# Patient Record
Sex: Male | Born: 1985 | Race: White | Hispanic: No | Marital: Married | Smoking: Former smoker
Health system: Southern US, Community
[De-identification: ages and names within clinical notes are randomized; demographics above are authoritative.]

## PROBLEM LIST (undated history)

## (undated) DIAGNOSIS — I10 Essential (primary) hypertension: Secondary | ICD-10-CM

## (undated) DIAGNOSIS — M5126 Other intervertebral disc displacement, lumbar region: Secondary | ICD-10-CM

## (undated) HISTORY — PX: HERNIA REPAIR: SHX51

---

## 2009-04-08 ENCOUNTER — Emergency Department: Payer: Self-pay | Admitting: Emergency Medicine

## 2010-03-10 ENCOUNTER — Emergency Department: Payer: Self-pay | Admitting: Emergency Medicine

## 2010-04-17 ENCOUNTER — Emergency Department: Payer: Self-pay | Admitting: Emergency Medicine

## 2010-06-01 ENCOUNTER — Emergency Department: Payer: Self-pay | Admitting: Emergency Medicine

## 2010-07-17 ENCOUNTER — Emergency Department: Payer: Self-pay | Admitting: Emergency Medicine

## 2010-12-12 ENCOUNTER — Ambulatory Visit: Payer: Self-pay | Admitting: Internal Medicine

## 2011-02-10 ENCOUNTER — Emergency Department: Payer: Self-pay | Admitting: Emergency Medicine

## 2011-06-23 ENCOUNTER — Emergency Department: Payer: Self-pay | Admitting: Emergency Medicine

## 2011-07-03 ENCOUNTER — Emergency Department: Payer: Self-pay | Admitting: *Deleted

## 2012-03-19 ENCOUNTER — Emergency Department: Payer: Self-pay | Admitting: Emergency Medicine

## 2012-07-13 ENCOUNTER — Emergency Department: Payer: Self-pay | Admitting: Emergency Medicine

## 2012-12-07 ENCOUNTER — Emergency Department: Payer: Self-pay | Admitting: Emergency Medicine

## 2013-10-05 ENCOUNTER — Emergency Department: Payer: Self-pay | Admitting: Emergency Medicine

## 2013-10-05 LAB — BASIC METABOLIC PANEL
ANION GAP: 3 — AB (ref 7–16)
BUN: 11 mg/dL (ref 7–18)
CO2: 30 mmol/L (ref 21–32)
CREATININE: 0.96 mg/dL (ref 0.60–1.30)
Calcium, Total: 9.1 mg/dL (ref 8.5–10.1)
Chloride: 103 mmol/L (ref 98–107)
EGFR (African American): >60
EGFR (Non-African Amer.): >60
GLUCOSE: 94 mg/dL (ref 65–99)
Osmolality: 271 (ref 275–301)
POTASSIUM: 3.7 mmol/L (ref 3.5–5.1)
Sodium: 136 mmol/L (ref 136–145)

## 2013-10-05 LAB — DRUG SCREEN, URINE

## 2013-10-05 LAB — CBC WITH DIFFERENTIAL/PLATELET
BASOS ABS: 0.1 10*3/uL (ref 0.0–0.1)
Basophil %: 0.7 %
EOS PCT: 2.1 %
Eosinophil #: 0.2 10*3/uL (ref 0.0–0.7)
HCT: 40.3 % (ref 40.0–52.0)
HGB: 13.9 g/dL (ref 13.0–18.0)
LYMPHS PCT: 22.5 %
Lymphocyte #: 2.7 10*3/uL (ref 1.0–3.6)
MCH: 30.4 pg (ref 26.0–34.0)
MCHC: 34.4 g/dL (ref 32.0–36.0)
MCV: 88 fL (ref 80–100)
MONO ABS: 0.7 x10 3/mm (ref 0.2–1.0)
Monocyte %: 6.2 %
NEUTROS ABS: 8.1 10*3/uL — AB (ref 1.4–6.5)
Neutrophil %: 68.5 %
Platelet: 201 10*3/uL (ref 150–440)
RBC: 4.56 10*6/uL (ref 4.40–5.90)
RDW: 13.9 % (ref 11.5–14.5)
WBC: 11.9 10*3/uL — ABNORMAL HIGH (ref 3.8–10.6)

## 2013-10-05 LAB — ETHANOL: Ethanol %: <0.003 % (ref 0.000–0.080)

## 2013-10-05 LAB — TROPONIN I

## 2013-12-10 ENCOUNTER — Emergency Department: Payer: Self-pay | Admitting: Emergency Medicine

## 2014-01-13 ENCOUNTER — Emergency Department: Payer: Self-pay | Admitting: Emergency Medicine

## 2014-01-13 LAB — COMPREHENSIVE METABOLIC PANEL
ANION GAP: 7 (ref 7–16)
AST: 12 U/L — AB (ref 15–37)
Albumin: 3.7 g/dL (ref 3.4–5.0)
Alkaline Phosphatase: 87 U/L
BUN: 10 mg/dL (ref 7–18)
Bilirubin,Total: 0.5 mg/dL (ref 0.2–1.0)
CHLORIDE: 102 mmol/L (ref 98–107)
Calcium, Total: 9.1 mg/dL (ref 8.5–10.1)
Co2: 28 mmol/L (ref 21–32)
Creatinine: 0.83 mg/dL (ref 0.60–1.30)
EGFR (Non-African Amer.): >60
Glucose: 95 mg/dL (ref 65–99)
OSMOLALITY: 273 (ref 275–301)
Potassium: 3.7 mmol/L (ref 3.5–5.1)
SGPT (ALT): 28 U/L (ref 12–78)
SODIUM: 137 mmol/L (ref 136–145)
Total Protein: 7.2 g/dL (ref 6.4–8.2)

## 2014-01-13 LAB — CBC WITH DIFFERENTIAL/PLATELET
Basophil #: 0 10*3/uL (ref 0.0–0.1)
Basophil %: 0.4 %
EOS PCT: 1.9 %
Eosinophil #: 0.2 10*3/uL (ref 0.0–0.7)
HCT: 42.8 % (ref 40.0–52.0)
HGB: 14.4 g/dL (ref 13.0–18.0)
LYMPHS ABS: 1.3 10*3/uL (ref 1.0–3.6)
LYMPHS PCT: 10 %
MCH: 29.9 pg (ref 26.0–34.0)
MCHC: 33.8 g/dL (ref 32.0–36.0)
MCV: 88 fL (ref 80–100)
MONO ABS: 0.5 x10 3/mm (ref 0.2–1.0)
Monocyte %: 4.4 %
NEUTROS ABS: 10.5 10*3/uL — AB (ref 1.4–6.5)
Neutrophil %: 83.3 %
Platelet: 205 10*3/uL (ref 150–440)
RBC: 4.84 10*6/uL (ref 4.40–5.90)
RDW: 14 % (ref 11.5–14.5)
WBC: 12.6 10*3/uL — ABNORMAL HIGH (ref 3.8–10.6)

## 2014-01-13 LAB — URINALYSIS, COMPLETE
BACTERIA: NONE SEEN
BILIRUBIN, UR: NEGATIVE
Blood: NEGATIVE
Glucose,UR: NEGATIVE mg/dL (ref 0–75)
Ketone: NEGATIVE
Leukocyte Esterase: NEGATIVE
Nitrite: NEGATIVE
PROTEIN: NEGATIVE
Ph: 5 (ref 4.5–8.0)
RBC,UR: <1 /HPF (ref 0–5)
Specific Gravity: 1.02 (ref 1.003–1.030)
Squamous Epithelial: <1

## 2014-01-13 LAB — MONONUCLEOSIS SCREEN: Mono Test: NEGATIVE

## 2014-01-13 LAB — LIPASE, BLOOD: LIPASE: 107 U/L (ref 73–393)

## 2014-01-14 LAB — ETHANOL: Ethanol %: <0.003 % (ref 0.000–0.080)

## 2014-02-15 ENCOUNTER — Emergency Department: Payer: Self-pay | Admitting: Emergency Medicine

## 2014-07-24 ENCOUNTER — Emergency Department: Payer: Self-pay | Admitting: Student

## 2014-09-24 ENCOUNTER — Emergency Department: Payer: Self-pay | Admitting: Emergency Medicine

## 2015-04-10 ENCOUNTER — Emergency Department
Admission: EM | Admit: 2015-04-10 | Discharge: 2015-04-11 | Disposition: A | Discharge status: Home / Self Care | Payer: Self-pay | Attending: Emergency Medicine | Admitting: Emergency Medicine

## 2015-04-10 ENCOUNTER — Encounter: Payer: Self-pay | Admitting: Emergency Medicine

## 2015-04-10 DIAGNOSIS — Y990 Civilian activity done for income or pay: Secondary | ICD-10-CM | POA: Insufficient documentation

## 2015-04-10 DIAGNOSIS — Y9289 Other specified places as the place of occurrence of the external cause: Secondary | ICD-10-CM | POA: Insufficient documentation

## 2015-04-10 DIAGNOSIS — X58XXXA Exposure to other specified factors, initial encounter: Secondary | ICD-10-CM | POA: Insufficient documentation

## 2015-04-10 DIAGNOSIS — Y9389 Activity, other specified: Secondary | ICD-10-CM | POA: Insufficient documentation

## 2015-04-10 DIAGNOSIS — Z720 Tobacco use: Secondary | ICD-10-CM | POA: Insufficient documentation

## 2015-04-10 DIAGNOSIS — G8929 Other chronic pain: Secondary | ICD-10-CM | POA: Insufficient documentation

## 2015-04-10 DIAGNOSIS — Z79899 Other long term (current) drug therapy: Secondary | ICD-10-CM | POA: Insufficient documentation

## 2015-04-10 DIAGNOSIS — I10 Essential (primary) hypertension: Secondary | ICD-10-CM | POA: Insufficient documentation

## 2015-04-10 DIAGNOSIS — S39012A Strain of muscle, fascia and tendon of lower back, initial encounter: Secondary | ICD-10-CM | POA: Insufficient documentation

## 2015-04-10 HISTORY — DX: Essential (primary) hypertension: I10

## 2015-04-10 MED ORDER — OXYCODONE-ACETAMINOPHEN 5-325 MG PO TABS
2.0000 | ORAL_TABLET | Freq: Once | ORAL | Status: AC
Start: 2015-04-10 — End: 2015-04-10
  Administered 2015-04-10: 2 via ORAL
  Filled 2015-04-10: qty 2

## 2015-04-10 MED ORDER — DEXAMETHASONE SODIUM PHOSPHATE 10 MG/ML IJ SOLN
10.0000 mg | Freq: Once | INTRAMUSCULAR | Status: AC
  Administered 2015-04-10: 10 mg via INTRAVENOUS
  Filled 2015-04-10: qty 1

## 2015-04-10 MED ORDER — KETOROLAC TROMETHAMINE 60 MG/2ML IM SOLN
60.0000 mg | Freq: Once | INTRAMUSCULAR | Status: AC
  Administered 2015-04-10: 60 mg via INTRAMUSCULAR
  Filled 2015-04-10: qty 2

## 2015-04-10 NOTE — ED Notes (Signed)
Pt reports chronic back pain, but worse over past 3 weeks.  Pt unsure about injury, states he does heavy lifting at work.  Pt reports numbness/tingling in upper right leg.  Pt NAD at this time.

## 2015-04-10 NOTE — ED Notes (Signed)
Pt with chronic back pain for over a year; about 3 weeks ago he thinks he "tweeked" his back; history of herniated discs; says tonight he felt like his right leg went numb; tingling at this time; no loss of bowel or bladder; pt ambulatory with slow steady gait

## 2015-04-11 ENCOUNTER — Emergency Department: Payer: Self-pay

## 2015-04-11 MED ORDER — OXYCODONE-ACETAMINOPHEN 5-325 MG PO TABS: 1.0000 | ORAL_TABLET | Freq: Four times a day (QID) | ORAL | Status: DC | PRN

## 2015-04-11 MED ORDER — NAPROXEN 500 MG PO TABS: 500.0000 mg | ORAL_TABLET | Freq: Two times a day (BID) | ORAL | Status: DC

## 2015-04-11 NOTE — ED Notes (Signed)
Pt taken to xray 

## 2015-04-11 NOTE — Discharge Instructions (Signed)
You were prescribed a medication that is potentially sedating. Do not drink alcohol, drive or participate in any other potentially dangerous activities while taking this medication as it may make you sleepy. Do not take this medication with any other sedating medications, either prescription or over-the-counter. If you were prescribed Percocet or Vicodin, do not take these with acetaminophen (Tylenol) as it is already contained within these medications.   Opioid pain medications (or "narcotics") can be habit forming.  Use it as little as possible to achieve adequate pain control.  Do not use or use it with extreme caution if you have a history of opiate abuse or dependence.  If you are on a pain contract with your primary care doctor or a pain specialist, be sure to let them know you were prescribed this medication today from the [facility] Department.  This medication is intended for your use only - do not give any to anyone else and keep it in a secure place where nobody else, especially children and pets, have access to it.  It will also cause or worsen constipation, so you may want to consider taking an over-the-counter stool softener while you are taking this medication.   Back Exercises Back exercises help treat and prevent back injuries. The goal of back exercises is to increase the strength of your abdominal and back muscles and the flexibility of your back. These exercises should be started when you no longer have back pain. Back exercises include:  Pelvic Tilt. Lie on your back with your knees bent. Tilt your pelvis until the lower part of your back is against the floor. Hold this position 5 to 10 sec and repeat 5 to 10 times.  Knee to Chest. Pull first 1 knee up against your chest and hold for 20 to 30 seconds, repeat this with the other knee, and then both knees. This may be done with the other leg straight or bent, whichever feels better.  Sit-Ups or Curl-Ups. Bend your  knees 90 degrees. Start with tilting your pelvis, and do a partial, slow sit-up, lifting your trunk only 30 to 45 degrees off the floor. Take at least 2 to 3 seconds for each sit-up. Do not do sit-ups with your knees out straight. If partial sit-ups are difficult, simply do the above but with only tightening your abdominal muscles and holding it as directed.  Hip-Lift. Lie on your back with your knees flexed 90 degrees. Push down with your feet and shoulders as you raise your hips a couple inches off the floor; hold for 10 seconds, repeat 5 to 10 times.  Back arches. Lie on your stomach, propping yourself up on bent elbows. Slowly press on your hands, causing an arch in your low back. Repeat 3 to 5 times. Any initial stiffness and discomfort should lessen with repetition over time.  Shoulder-Lifts. Lie face down with arms beside your body. Keep hips and torso pressed to floor as you slowly lift your head and shoulders off the floor. Do not overdo your exercises, especially in the beginning. Exercises may cause you some mild back discomfort which lasts for a few minutes; however, if the pain is more severe, or lasts for more than 15 minutes, do not continue exercises until you see your caregiver. Improvement with exercise therapy for back problems is slow.  See your caregivers for assistance with developing a proper back exercise program. Document Released: 08/17/2004 Document Revised: 10/02/2011 Document Reviewed: 05/11/2011 [facility] Patient Information 2015 [place], [place]. This  information is not intended to replace advice given to you by your health care provider. Make sure you discuss any questions you have with your health care provider.  Back Pain, Adult Low back pain is very common. About 1 in 5 people have back pain.The cause of low back pain is rarely dangerous. The pain often gets better over time.About half of people with a sudden onset of back pain feel better in just 2 weeks. About 8 in  10 people feel better by 6 weeks.  CAUSES Some common causes of back pain include:  Strain of the muscles or ligaments supporting the spine.  Wear and tear (degeneration) of the spinal discs.  Arthritis.  Direct injury to the back. DIAGNOSIS Most of the time, the direct cause of low back pain is not known.However, back pain can be treated effectively even when the exact cause of the pain is unknown.Answering your caregiver's questions about your overall health and symptoms is one of the most accurate ways to make sure the cause of your pain is not dangerous. If your caregiver needs more information, he or she may order lab work or imaging tests (X-rays or MRIs).However, even if imaging tests show changes in your back, this usually does not require surgery. HOME CARE INSTRUCTIONS For many people, back pain returns.Since low back pain is rarely dangerous, it is often a condition that people can learn to [facility] their own.   Remain active. It is stressful on the back to sit or stand in one place. Do not sit, drive, or stand in one place for more than 30 minutes at a time. Take short walks on level surfaces as soon as pain allows.Try to increase the length of time you walk each day.  Do not stay in bed.Resting more than 1 or 2 days can delay your recovery.  Do not avoid exercise or work.Your body is made to move.It is not dangerous to be active, even though your back may hurt.Your back will likely heal faster if you return to being active before your pain is gone.  Pay attention to your body when you bend and lift. Many people have less discomfortwhen lifting if they bend their knees, keep the load close to their bodies,and avoid twisting. Often, the most comfortable positions are those that put less stress on your recovering back.  Find a comfortable position to sleep. Use a firm mattress and lie on your side with your knees slightly bent. If you lie on your back, put a pillow  under your knees.  Only take over-the-counter or prescription medicines as directed by your caregiver. Over-the-counter medicines to reduce pain and inflammation are often the most helpful.Your caregiver may prescribe muscle relaxant drugs.These medicines help dull your pain so you can more quickly return to your normal activities and healthy exercise.  Put ice on the injured area.  Put ice in a plastic bag.  Place a towel between your skin and the bag.  Leave the ice on for 15-20 minutes, 03-04 times a day for the first 2 to 3 days. After that, ice and heat may be alternated to reduce pain and spasms.  Ask your caregiver about trying back exercises and gentle massage. This may be of some benefit.  Avoid feeling anxious or stressed.Stress increases muscle tension and can worsen back pain.It is important to recognize when you are anxious or stressed and learn ways to manage it.Exercise is a great option. SEEK MEDICAL CARE IF:  You have pain that is  not relieved with rest or medicine.  You have pain that does not improve in 1 week.  You have new symptoms.  You are generally not feeling well. SEEK IMMEDIATE MEDICAL CARE IF:   You have pain that radiates from your back into your legs.  You develop new bowel or bladder control problems.  You have unusual weakness or numbness in your arms or legs.  You develop nausea or vomiting.  You develop abdominal pain.  You feel faint. Document Released: 07/10/2005 Document Revised: 01/09/2012 Document Reviewed: 11/11/2013 [facility] Patient Information 2015 [place], [place]. This information is not intended to replace advice given to you by your health care provider. Make sure you discuss any questions you have with your health care provider.  Lumbosacral Strain Lumbosacral strain is a strain of any of the parts that make up your lumbosacral vertebrae. Your lumbosacral vertebrae are the bones that make up the lower third of your backbone.  Your lumbosacral vertebrae are held together by muscles and tough, fibrous tissue (ligaments).  CAUSES  A sudden blow to your back can cause lumbosacral strain. Also, anything that causes an excessive stretch of the muscles in the low back can cause this strain. This is typically seen when people exert themselves strenuously, fall, lift heavy objects, bend, or crouch repeatedly. RISK FACTORS  Physically demanding work.  Participation in pushing or pulling sports or sports that require a sudden twist of the back (tennis, golf, baseball).  Weight lifting.  Excessive lower back curvature.  Forward-tilted pelvis.  Weak back or abdominal muscles or both.  Tight hamstrings. SIGNS AND SYMPTOMS  Lumbosacral strain may cause pain in the area of your injury or pain that moves (radiates) down your leg.  DIAGNOSIS Your health care provider can often diagnose lumbosacral strain through a physical exam. In some cases, you may need tests such as X-ray exams.  TREATMENT  Treatment for your lower back injury depends on many factors that your clinician will have to evaluate. However, most treatment will include the use of anti-inflammatory medicines. HOME CARE INSTRUCTIONS   Avoid hard physical activities (tennis, racquetball, waterskiing) if you are not in proper physical condition for it. This may aggravate or create problems.  If you have a back problem, avoid sports requiring sudden body movements. Swimming and walking are generally safer activities.  Maintain good posture.  Maintain a healthy weight.  For acute conditions, you may put ice on the injured area.  Put ice in a plastic bag.  Place a towel between your skin and the bag.  Leave the ice on for 20 minutes, 2-3 times a day.  When the low back starts healing, stretching and strengthening exercises may be recommended. SEEK MEDICAL CARE IF:  Your back pain is getting worse.  You experience severe back pain not relieved with  medicines. SEEK IMMEDIATE MEDICAL CARE IF:   You have numbness, tingling, weakness, or problems with the use of your arms or legs.  There is a change in bowel or bladder control.  You have increasing pain in any area of the body, including your belly (abdomen).  You notice shortness of breath, dizziness, or feel faint.  You feel sick to your stomach (nauseous), are throwing up (vomiting), or become sweaty.  You notice discoloration of your toes or legs, or your feet get very cold. MAKE SURE YOU:   Understand these instructions.  Will watch your condition.  Will get help right away if you are not doing well or get worse. Document  Released: 04/19/2005 Document Revised: 07/15/2013 Document Reviewed: 02/26/2013 [facility] Patient Information 2015 [place], [place]. This information is not intended to replace advice given to you by your health care provider. Make sure you discuss any questions you have with your health care provider.

## 2015-04-11 NOTE — ED Provider Notes (Signed)
[facility] Emergency Department Provider Note  ____________________________________________  Time seen: 11:30 PM  I have reviewed the triage vital signs and the nursing notes.   HISTORY  Chief Complaint Back Pain    HPI Brent Cuevas is a 29 y.o. male with chronic low back pain with right-sided sciatica due to previous injuries. 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 No numbness tingling weakness or urinary retention or incontinence. No bowel and retention or incontinence. No falls or other injuries. He complains of persistent low back pain that is worse than his previous.Denies any opioid use in the past month.     Past Medical History  Diagnosis Date  . Hypertension    Chronic back pain  There are no active problems to display for this patient.    History reviewed. No pertinent past surgical history.   Current Outpatient Rx  Name  Route  Sig  Dispense  Refill  . amLODipine (NORVASC) 5 MG tablet   Oral   Take 5 mg by mouth daily.         . dicloxacillin (DYNAPEN) 250 MG capsule   Oral   Take 250 mg by mouth 4 (four) times daily as needed.         Marland Kitchen HYDROcodone-acetaminophen (NORCO) 10-325 MG per tablet   Oral   Take 1 tablet by mouth every 6 (six) hours as needed.         Marland Kitchen LORazepam (ATIVAN) 1 MG tablet   Oral   Take 1 mg by mouth every 8 (eight) hours as needed for anxiety.         Marland Kitchen oxyCODONE-acetaminophen (PERCOCET) 10-325 MG per tablet   Oral   Take 1 tablet by mouth every 6 (six) hours as needed for pain.         . naproxen (NAPROSYN) 500 MG tablet   Oral   Take 1 tablet (500 mg total) by mouth 2 (two) times daily with a meal.   20 tablet   0   . oxyCODONE-acetaminophen (ROXICET) 5-325 MG per tablet   Oral   Take 1 tablet by mouth every 6 (six) hours as needed for  severe pain.   10 tablet   0      Allergies Review of patient's allergies indicates no known allergies.   History reviewed. No pertinent family history.  Social History Social History  Substance Use Topics  . Smoking status: Current Every Day Smoker -- 1.00 packs/day    Types: Cigarettes  . Smokeless tobacco: None  . Alcohol Use: No    Review of Systems  Constitutional:   No fever or chills. No weight changes Eyes:   No blurry vision or double vision.  ENT:   No sore throat. Cardiovascular:   No chest pain. Respiratory:   No dyspnea or cough. Gastrointestinal:   Negative for abdominal pain, vomiting and diarrhea.  No BRBPR or melena. Genitourinary:   Negative for dysuria, urinary retention, bloody urine, or difficulty urinating. Musculoskeletal:   Acute on chronic low back pain  Skin:   Negative for rash. Neurological:   Negative for headaches, focal weakness or numbness. Psychiatric:  No anxiety or depression.   Endocrine:  No hot/cold intolerance, changes in energy, or sleep difficulty.  10-point ROS otherwise negative.  ____________________________________________   PHYSICAL EXAM:  VITAL SIGNS: ED Triage  Vitals  Enc Vitals Group     BP 04/10/15 2109 137/86 mmHg     Pulse Rate 04/10/15 2109 71     Resp 04/10/15 2109 18     Temp 04/10/15 2109 98.2 F (36.8 C)     Temp Source 04/10/15 2109 Oral     SpO2 04/10/15 2109 98 %     Weight 04/10/15 2109 250 lb (113.399 kg)     Height 04/10/15 2109  (1.778 m)     Head Cir --      Peak Flow --      Pain Score 04/10/15 2107 7     Pain Loc --      Pain Edu? --      Excl. in GC? --      Constitutional:   Alert and oriented. Well appearing and in no distress. Eyes:   No scleral icterus. No conjunctival pallor. PERRL. EOMI ENT   Head:   Normocephalic and atraumatic.   Nose:   No congestion/rhinnorhea. No septal hematoma   Mouth/Throat:   MMM, no pharyngeal erythema. No peritonsillar mass. No  uvula shift.   Neck:   No stridor. No SubQ emphysema. No meningismus. Hematological/Lymphatic/Immunilogical:   No cervical lymphadenopathy. Cardiovascular:   RRR. Normal and symmetric distal pulses are present in all extremities. No murmurs, rubs, or gallops. Respiratory:   Normal respiratory effort without tachypnea nor retractions. Breath sounds are clear and equal bilaterally. No wheezes/rales/rhonchi. Gastrointestinal:   Soft and nontender. No distention. There is no CVA tenderness.  No rebound, rigidity, or guarding. Genitourinary:   deferred Musculoskeletal:   Mild lumbar tenderness at all levels. Right paraspinous tenderness. Straight leg raise positive on the right at 30. Pain is accentuated with twisting motion of the thorax.  Neurologic:   Normal speech and language.  CN 2-10 normal. Motor grossly intact. No pronator drift.  Normal gait. EHL positive. Sensation intact. No gross focal neurologic deficits are appreciated.  Skin:    Skin is warm, dry and intact. No rash noted.  No petechiae, purpura, or bullae. Psychiatric:   Mood and affect are normal. Speech and behavior are normal. Patient exhibits appropriate insight and judgment.  ____________________________________________    LABS (pertinent positives/negatives) (all labs ordered are listed, but only abnormal results are displayed) Labs Reviewed - No data to display ____________________________________________   EKG    ____________________________________________    RADIOLOGY  X-ray L-spine unremarkable  ____________________________________________   PROCEDURES   ____________________________________________   INITIAL IMPRESSION / ASSESSMENT AND PLAN / ED COURSE  Pertinent labs & imaging results that were available during my care of the patient were reviewed by me and considered in my medical decision making (see chart for details).  Patient presents with acute lumbar strain and back pain in the  setting of chronic back pain. It seems that he's injured another level. We'll put him on a course of naproxen with when necessary Percocet and have him follow-up with his doctors. He is previously seen orthopedics and primary care and has a referral to try and go to orthopedics for continued evaluation. Continue to continue following up with these people. No evidence of cauda equina or other spinal cord emergency.     ____________________________________________   FINAL CLINICAL IMPRESSION(S) / ED DIAGNOSES  Final diagnoses:  Lumbar strain, initial encounter      Sharman Cheek, MD 04/11/15 0157

## 2015-08-19 ENCOUNTER — Ambulatory Visit: Payer: Self-pay | Admitting: Anesthesiology

## 2015-09-08 ENCOUNTER — Ambulatory Visit: Payer: Self-pay | Admitting: Anesthesiology

## 2015-10-25 ENCOUNTER — Emergency Department
Admission: EM | Admit: 2015-10-25 | Discharge: 2015-10-25 | Disposition: A | Discharge status: Home / Self Care | Payer: Self-pay | Attending: Emergency Medicine | Admitting: Emergency Medicine

## 2015-10-25 ENCOUNTER — Encounter: Payer: Self-pay | Admitting: Emergency Medicine

## 2015-10-25 DIAGNOSIS — K645 Perianal venous thrombosis: Secondary | ICD-10-CM | POA: Insufficient documentation

## 2015-10-25 DIAGNOSIS — F17210 Nicotine dependence, cigarettes, uncomplicated: Secondary | ICD-10-CM | POA: Insufficient documentation

## 2015-10-25 DIAGNOSIS — I10 Essential (primary) hypertension: Secondary | ICD-10-CM | POA: Insufficient documentation

## 2015-10-25 MED ORDER — OXYCODONE-ACETAMINOPHEN 5-325 MG PO TABS: 2.0000 | ORAL_TABLET | Freq: Once | ORAL | Status: DC

## 2015-10-25 MED ORDER — LIDOCAINE-EPINEPHRINE 1 %-1:100000 IJ SOLN
5.0000 mL | Freq: Once | INTRAMUSCULAR | Status: DC
  Filled 2015-10-25: qty 5

## 2015-10-25 MED ORDER — OXYCODONE-ACETAMINOPHEN 5-325 MG PO TABS: 1.0000 | ORAL_TABLET | Freq: Once | ORAL | Status: DC

## 2015-10-25 MED ORDER — OXYCODONE-ACETAMINOPHEN 5-325 MG PO TABS
ORAL_TABLET | ORAL | Status: AC
  Administered 2015-10-25: 1 via ORAL
  Filled 2015-10-25: qty 1

## 2015-10-25 MED ORDER — OXYCODONE-ACETAMINOPHEN 5-325 MG PO TABS: 1.0000 | ORAL_TABLET | ORAL | Status: DC | PRN

## 2015-10-25 MED ORDER — OXYCODONE-ACETAMINOPHEN 5-325 MG PO TABS
1.0000 | ORAL_TABLET | ORAL | Status: AC | PRN
  Administered 2015-10-25 (×2): 1 via ORAL
  Filled 2015-10-25: qty 1

## 2015-10-25 MED ORDER — LIDOCAINE-EPINEPHRINE (PF) 1 %-1:200000 IJ SOLN
INTRAMUSCULAR | Status: AC
  Filled 2015-10-25: qty 30

## 2015-10-25 NOTE — Discharge Instructions (Signed)
Follow-up with surgeon listed on her papers if any continued problems. Take Percocet as needed for pain but also take a stool softener along with this medication to prevent constipation. Sitz baths frequently today

## 2015-10-25 NOTE — ED Provider Notes (Signed)
[facility] Emergency Department Provider Note  ____________________________________________  Time seen: Approximately 7:22 AM  I have reviewed the triage vital signs and the nursing notes.   HISTORY  Chief Complaint Hemorrhoids   HPI Brent Cuevas is a 30 y.o. male is here with complaint of possible hemorrhoids. Patient state he was lifting a heavy hospital bed 3 days ago and has had pain since that time. Patient is use some over-the-counter medications including suppositories without any improvement. Patient states he has had pain over the weekend but was unable to come to the emergency room due to his grandfather's death. Patient did bring someone to drive.Patient was given Percocet while in the waiting room prior to his evaluation. Currently he rates his pain as an 8/10.   Past Medical History  Diagnosis Date  . Hypertension     There are no active problems to display for this patient.   History reviewed. No pertinent past surgical history.  Current Outpatient Rx  Name  Route  Sig  Dispense  Refill  . amLODipine (NORVASC) 5 MG tablet   Oral   Take 5 mg by mouth daily.         . dicloxacillin (DYNAPEN) 250 MG capsule   Oral   Take 250 mg by mouth 4 (four) times daily as needed.         Marland Kitchen LORazepam (ATIVAN) 1 MG tablet   Oral   Take 1 mg by mouth every 8 (eight) hours as needed for anxiety.         Marland Kitchen oxyCODONE-acetaminophen (PERCOCET) 5-325 MG tablet   Oral   Take 1 tablet by mouth every 4 (four) hours as needed for severe pain.   20 tablet   0     Allergies Review of patient's allergies indicates no known allergies.  No family history on file.  Social History Social History  Substance Use Topics  . Smoking status: Current Every Day Smoker -- 0.00 packs/day    Types: Cigarettes  . Smokeless tobacco: None  . Alcohol Use: No    Review of Systems Constitutional: No fever/chills Cardiovascular: Denies chest  pain. Respiratory: Denies shortness of breath. Gastrointestinal:  No nausea, no vomiting.  No diarrhea.  No constipation. Questionable hemorrhoids. Skin: Negative for rash. Neurological: Negative for headaches  10-point ROS otherwise negative.  ____________________________________________   PHYSICAL EXAM:  VITAL SIGNS: ED Triage Vitals  Enc Vitals Group     BP 10/25/15 0511 149/79 mmHg     Pulse Rate 10/25/15 0511 87     Resp 10/25/15 0511 18     Temp 10/25/15 0511 97.7 F (36.5 C)     Temp Source 10/25/15 0511 Oral     SpO2 10/25/15 0511 97 %     Weight 10/25/15 0511 250 lb (113.399 kg)     Height 10/25/15 0511  (1.778 m)     Head Cir --      Peak Flow --      Pain Score 10/25/15 0511 8     Pain Loc --      Pain Edu? --      Excl. in GC? --     Constitutional: Alert and oriented. Well appearing and in no acute distress. Eyes: Conjunctivae are normal. PERRL. EOMI. Head: Atraumatic. Nose: No congestion/rhinnorhea. Neck: No stridor.   Cardiovascular: Normal rate, regular rhythm. Grossly normal heart sounds.  Good peripheral circulation. Respiratory: Normal respiratory effort.  No retractions. Lungs CTAB. Gastrointestinal: Soft and nontender. No  distention. On rectal exam there is a large thrombosed hemorrhoid present that is extremely tender to palpation. Musculoskeletal: Moves upper and lower extremities without any difficulty. Neurologic:  Normal speech and language. No gross focal neurologic deficits are appreciated. No gait instability. Skin:  Skin is warm, dry and intact. No rash noted. Psychiatric: Mood and affect are normal. Speech and behavior are normal.  ____________________________________________   LABS (all labs ordered are listed, but only abnormal results are displayed)  Labs Reviewed - No data to display  PROCEDURES  Procedure(s) performed: INCISION AND DRAINAGE Performed by: Tommi Rumpshonda L Lewie Deman Consent: Verbal consent obtained. Risks and  benefits: risks, benefits and alternatives were discussed Type: abscess  Body area: Rectal area/hemorrhoid  Anesthesia: local infiltration  Incision was made with a scalpel.  Local anesthetic: lidocaine 1 % with epinephrine  Anesthetic total: 2 ml  Complexity: Simple Blunt dissection to break up loculations  Drainage: Thrombosis   Drainage amount: Multiple    Patient tolerance: Patient tolerated the procedure well with no immediate complications.    Critical Care performed: No  ____________________________________________   INITIAL IMPRESSION / ASSESSMENT AND PLAN / ED COURSE  Pertinent labs & imaging results that were available during my care of the patient were reviewed by me and considered in my medical decision making (see chart for details).  Patient is given Percocet as needed for pain. Patient is aware that he needs to take a stool softener with the pain medication to avoid constipation and increase fiber. Our surgeon on call listed on his papers if any continued problems. Also he was instructed to use sitz baths frequently today. ____________________________________________   FINAL CLINICAL IMPRESSION(S) / ED DIAGNOSES  Final diagnoses:  Thrombosed external hemorrhoids      Tommi RumpsRhonda L Zeniyah Peaster, PA-C 10/25/15 1540  Jene Everyobert Kinner, MD 10/25/15 [phone]

## 2015-10-25 NOTE — ED Notes (Signed)
Patient states that he has hemorrhoids and is having a lot of pain. Patient states that he has tried multiple OTC medications with no improvement.

## 2015-10-25 NOTE — ED Notes (Signed)
Patient instructed not to drive for 4 hours after taking narcotic pain medication. Patient verbalizes understanding.

## 2015-10-25 NOTE — ED Notes (Signed)
States he developed a possible hemorrhoid after lifting a heavy hospital bed 3 days ago.

## 2015-10-26 ENCOUNTER — Ambulatory Visit: Payer: Self-pay | Attending: Anesthesiology | Admitting: Anesthesiology

## 2015-10-26 ENCOUNTER — Encounter: Payer: Self-pay | Admitting: Anesthesiology

## 2015-10-26 VITALS — BP 125/76 | HR 74 | Temp 97.3°F | Resp 16 | Ht 70.0 in | Wt 250.0 lb

## 2015-10-26 DIAGNOSIS — M5136 Other intervertebral disc degeneration, lumbar region: Secondary | ICD-10-CM

## 2015-10-26 DIAGNOSIS — G8929 Other chronic pain: Secondary | ICD-10-CM

## 2015-10-26 DIAGNOSIS — M47817 Spondylosis without myelopathy or radiculopathy, lumbosacral region: Secondary | ICD-10-CM

## 2015-10-26 DIAGNOSIS — M5386 Other specified dorsopathies, lumbar region: Secondary | ICD-10-CM

## 2015-10-26 DIAGNOSIS — M545 Low back pain: Secondary | ICD-10-CM

## 2015-10-26 MED ORDER — METHOCARBAMOL 750 MG PO TABS: 750.0000 mg | ORAL_TABLET | Freq: Two times a day (BID) | ORAL | Status: DC

## 2015-10-26 MED ORDER — METHOCARBAMOL 750 MG PO TABS: 750.0000 mg | ORAL_TABLET | ORAL | Status: DC

## 2015-10-26 NOTE — Progress Notes (Signed)
Safety precautions to be maintained throughout the outpatient stay will include: orient to surroundings, keep bed in low position, maintain call bell within reach at all times, provide assistance with transfer out of bed and ambulation.  

## 2015-10-26 NOTE — Progress Notes (Signed)
Subjective:  Patient ID: Brent Cuevas, male    DOB: 04/09/1986  Age: 30 y.o. MRN: [id number]  CC: Back Pain   HPI Brent Cuevas presents for A new patient evaluation. He goes by Brent Cuevas and he is a pleasant 30 year old white male with a 18 month history of low back pain following a softball tournament. He describes a pain that has gradually gotten worse with a VAS score of 9 and best a 4. The pain is present throughout the day worse in the morning and at night and with activity. Bending kneeling lifting standing and rotation seemed to aggravate the pain and nothing much has given him relief except medication management and lying down. He had epidural steroids several months ago yielding no improvement in his pain and oral steroids were of no help. The pain is described as nagging constant dreadful with associated pain that wakes him up at night but no problems with bowel or bladder dysfunction. He occasionally has some low back cramping and occasional tingling to the right lower foot but this is mainly associated with long car rides or prolonged sitting. He did have a previous CT exam showing evidence of an L4-5 disc degeneration but was otherwise unremarkable with mild degenerative changes.  History Brent Cuevas has a past medical history of Hypertension.   He has past surgical history that includes Hernia repair.   His family history includes Depression in his maternal grandmother; Diabetes in his paternal grandfather and paternal grandmother; Hypertension in his maternal grandmother, paternal grandfather, and paternal grandmother.He reports that he has been smoking Cigarettes.  He has been smoking about 0.00 packs per day. He does not have any smokeless tobacco history on file. He reports that he does not drink alcohol or use illicit drugs.   ---------------------------------------------------------------------------------------------------------------------- Past Medical History   Diagnosis Date  . Hypertension     Past Surgical History  Procedure Laterality Date  . Hernia repair      as an infant    Family History  Problem Relation Age of Onset  . Depression Maternal Grandmother   . Hypertension Maternal Grandmother   . Hypertension Paternal Grandmother   . Diabetes Paternal Grandmother   . Hypertension Paternal Grandfather   . Diabetes Paternal Grandfather     Social History  Substance Use Topics  . Smoking status: Current Every Day Smoker -- 0.00 packs/day    Types: Cigarettes  . Smokeless tobacco: Not on file  . Alcohol Use: No    ---------------------------------------------------------------------------------------------------------------------- Social History   Social History  . Marital Status: Married    Spouse Name: N/A  . Number of Children: N/A  . Years of Education: N/A   Social History Main Topics  . Smoking status: Current Every Day Smoker -- 0.00 packs/day    Types: Cigarettes  . Smokeless tobacco: None  . Alcohol Use: No  . Drug Use: No  . Sexual Activity: Not Asked   Other Topics Concern  . None   Social History Narrative      ----------------------------------------------------------------------------------------------------------------------  ROS Review of Systems   Cardiac: High blood pressure Pulmonary: Smoker Neurologic: Negative GU: Negative   Objective:  BP 125/76 mmHg  Pulse 74  Temp(Src) 97.3 F (36.3 C) (Oral)  Resp 16  Ht  (1.778 m)  Wt 250 lb (113.399 kg)  BMI 35.87 kg/m2  SpO2 98%  Physical Exam  Patient is alert oriented cooperative compliant and a good historian Heart is regular rate and rhythm without murmur Lungs clear to  auscultation Inspection of the low back reveals paraspinous muscle tenderness and pain with extension and lateral rotation right greater than left in the standing position Negative straight leg raise bilaterally and generalized paraspinous muscle  tenderness. Muscle tone and bulk is good     Assessment & Plan:   Brent Cuevas was seen today for back pain.  Diagnoses and all orders for this visit:  Chronic midline low back pain without sciatica -     Cancel: Drug Screen Urine w/Alc, with Conf.; Standing -     ToxASSURE Select 13 (MW), Urine  Facet arthritis of lumbosacral region  DDD (degenerative disc disease), lumbar  Low back derangement syndrome  Other orders -     Discontinue: methocarbamol (ROBAXIN) 750 MG tablet; Take 1 tablet (750 mg total) by mouth 2 (two) times a week. -     methocarbamol (ROBAXIN) 750 MG tablet; Take 1 tablet (750 mg total) by mouth 2 (two) times daily.     ----------------------------------------------------------------------------------------------------------------------  Problem List Items Addressed This Visit    None    Visit Diagnoses    Chronic midline low back pain without sciatica    -  Primary    Relevant Orders    ToxASSURE Select 13 (MW), Urine    Facet arthritis of lumbosacral region        Relevant Medications    oxycodone-acetaminophen (LYNOX) 10-300 MG tablet    methocarbamol (ROBAXIN) 750 MG tablet    DDD (degenerative disc disease), lumbar        Relevant Medications    oxycodone-acetaminophen (LYNOX) 10-300 MG tablet    methocarbamol (ROBAXIN) 750 MG tablet    Low back derangement syndrome        Relevant Medications    oxycodone-acetaminophen (LYNOX) 10-300 MG tablet    methocarbamol (ROBAXIN) 750 MG tablet       ----------------------------------------------------------------------------------------------------------------------  1. Chronic midline low back pain without sciatica We will start him on Robaxin 750 mg 1 tablet at bedtime. He may increase to 2 tablets at bedtime in 1 week if no improvement in his lower back spasming - ToxASSURE Select 13 (MW), Urine  2. Facet arthritis of lumbosacral region We will defer on diagnostic facet injection until he  has insurance secondary to cost.  3. DDD (degenerative disc disease), lumbar Stretching and strengthening exercises  4. Low back derangement syndrome Stretching strengthening exercises with core strengthening and physical therapy as described today with return to clinic in 1 month He has also asked that we prescribed Percocet and I have informed him that this will be contingent on clinic policy and the toxassure screening and will be for a limited period of time.    ----------------------------------------------------------------------------------------------------------------------  I have changed Brent Cuevas's methocarbamol. I am also having him maintain his LORazepam, amLODipine, dicloxacillin, oxyCODONE-acetaminophen, and oxycodone-acetaminophen.   Meds ordered this encounter  Medications  . oxycodone-acetaminophen (LYNOX) 10-300 MG tablet    Sig: Take 1 tablet by mouth every 4 (four) hours as needed for pain.  Marland Kitchen. DISCONTD: methocarbamol (ROBAXIN) 750 MG tablet    Sig: Take 1 tablet (750 mg total) by mouth 2 (two) times a week.    Dispense:  60 tablet    Refill:  1  . methocarbamol (ROBAXIN) 750 MG tablet    Sig: Take 1 tablet (750 mg total) by mouth 2 (two) times daily.    Dispense:  60 tablet    Refill:  1       Follow-up: Return in about 1 month (around 11/25/2015)  for evaluation.    Yevette Edwards, MD

## 2015-10-26 NOTE — Patient Instructions (Signed)
Facet Joint Block  The facet joints connect the bones of the spine (vertebrae). They make it possible for you to bend, twist, and make other movements with your spine. They also prevent you from overbending, overtwisting, and making other excessive movements.   A facet joint block is a procedure where a numbing medicine (anesthetic) is injected into a facet joint. Often, a type of anti-inflammatory medicine called a steroid is also injected. A facet joint block may be done for two reasons:   · Diagnosis. A facet joint block may be done as a test to see whether neck or back pain is caused by a worn-down or infected facet joint. If the pain gets better after a facet joint block, it means the pain is probably coming from the facet joint. If the pain does not get better, it means the pain is probably not coming from the facet joint.    · Therapy. A facet joint block may be done to relieve neck or back pain caused by a facet joint. A facet joint block is only done as a therapy if the pain does not improve with medicine, exercise programs, physical therapy, and other forms of pain management.  LET YOUR HEALTH CARE PROVIDER KNOW ABOUT:   · Any allergies you have.    · All medicines you are taking, including vitamins, herbs, eyedrops, and over-the-counter medicines and creams.    · Previous problems you or members of your family have had with the use of anesthetics.    · Any blood disorders you have had.    · Other health problems you have.  RISKS AND COMPLICATIONS  Generally, having a facet joint block is safe. However, as with any procedure, complications can occur. Possible complications associated with having a facet joint block include:   · Bleeding.    · Injury to a nerve near the injection site.    · Pain at the injection site.    · Weakness or numbness in areas controlled by nerves near the injection site.    · Infection.    · Temporary fluid retention.    · Allergic reaction to anesthetics or medicines used during  the procedure.  BEFORE THE PROCEDURE   · Follow your health care provider's instructions if you are taking dietary supplements or medicines. You may need to stop taking them or reduce your dosage.    · Do not take any new dietary supplements or medicines without asking your health care provider first.    · Follow your health care provider's instructions about eating and drinking before the procedure. You may need to stop eating and drinking several hours before the procedure.    · Arrange to have an adult drive you home after the procedure.  PROCEDURE  · You may need to remove your clothing and dress in an open-back gown so that your health care provider can access your spine.    · The procedure will be done while you are lying on an X-ray table. Most of the time you will be asked to lie on your stomach, but you may be asked to lie in a different position if an injection will be made in your neck.    · Special machines will be used to monitor your oxygen levels, heart rate, and blood pressure.    · If an injection will be made in your neck, an intravenous (IV) tube will be inserted into one of your veins. Fluids and medicine will flow directly into your body through the IV tube.    · The area over the facet joint where the   injection will be made will be cleaned with an antiseptic soap. The surrounding skin will be covered with sterile drapes.    · An anesthetic will be applied to your skin to make the injection area numb. You may feel a temporary stinging or burning sensation.    · A video X-ray machine will be used to locate the joint. A contrast dye may be injected into the facet joint area to help with locating the joint.    · When the joint is located, an anesthetic medicine will be injected into the joint through the needle.    · Your health care provider will ask you whether you feel pain relief. If you do feel relief, a steroid may be injected to provide pain relief for a longer period of time. If you do not  feel relief or feel only partial relief, additional injections of an anesthetic may be made in other facet joints.    · The needle will be removed, the skin will be cleansed, and bandages will be applied.    AFTER THE PROCEDURE   · You will be observed for 15-30 minutes before being allowed to go home. Do not drive. Have an adult drive you or take a taxi or public transportation instead.    · If you feel pain relief, the pain will return in several hours or days when the anesthetic wears off.    · You may feel pain relief 2-14 days after the procedure. The amount of time this relief lasts varies from person to person.    · It is normal to feel some tenderness over the injected area(s) for 2 days following the procedure.    · If you have diabetes, you may have a temporary increase in blood sugar.      This information is not intended to replace advice given to you by your health care provider. Make sure you discuss any questions you have with your health care provider.     Document Released: 11/29/2006 Document Revised: 07/31/2014 Document Reviewed: 04/29/2012  Elsevier Interactive Patient Education ©2016 [employer]

## 2015-11-03 LAB — TOXASSURE SELECT 13 (MW), URINE: PDF: 0

## 2015-12-06 ENCOUNTER — Encounter: Payer: Self-pay | Admitting: Anesthesiology

## 2015-12-06 ENCOUNTER — Ambulatory Visit: Payer: Self-pay | Attending: Anesthesiology | Admitting: Anesthesiology

## 2015-12-06 VITALS — BP 131/87 | HR 77 | Temp 97.9°F | Resp 16 | Ht 71.0 in | Wt 255.0 lb

## 2015-12-06 DIAGNOSIS — M545 Low back pain: Secondary | ICD-10-CM | POA: Insufficient documentation

## 2015-12-06 DIAGNOSIS — M47817 Spondylosis without myelopathy or radiculopathy, lumbosacral region: Secondary | ICD-10-CM

## 2015-12-06 DIAGNOSIS — M5386 Other specified dorsopathies, lumbar region: Secondary | ICD-10-CM

## 2015-12-06 DIAGNOSIS — M5136 Other intervertebral disc degeneration, lumbar region: Secondary | ICD-10-CM | POA: Insufficient documentation

## 2015-12-06 DIAGNOSIS — M47898 Other spondylosis, sacral and sacrococcygeal region: Secondary | ICD-10-CM | POA: Insufficient documentation

## 2015-12-06 DIAGNOSIS — G8929 Other chronic pain: Secondary | ICD-10-CM | POA: Insufficient documentation

## 2015-12-06 DIAGNOSIS — F17210 Nicotine dependence, cigarettes, uncomplicated: Secondary | ICD-10-CM | POA: Insufficient documentation

## 2015-12-06 DIAGNOSIS — I10 Essential (primary) hypertension: Secondary | ICD-10-CM | POA: Insufficient documentation

## 2015-12-06 MED ORDER — OXYCODONE-ACETAMINOPHEN 5-325 MG PO TABS: 1.0000 | ORAL_TABLET | ORAL | Status: DC | PRN

## 2015-12-06 NOTE — Progress Notes (Signed)
Patient here for medication management.  Patient needs refill for oxycodone-acetaminophen 10 - 325 mg and Robaxin 750 mg  Safety precautions to be maintained throughout the outpatient stay will include: orient to surroundings, keep bed in low position, maintain call bell within reach at all times, provide assistance with transfer out of bed and ambulation.

## 2015-12-07 NOTE — Progress Notes (Signed)
Subjective:  Patient ID: Brent Cuevas, male    DOB: April 02, 1986  Age: 30 y.o. MRN: [id number]  CC: Back Pain  Procedure: None   HPI ZALYN AMEND presents for repeat evaluation. he has a 18 month history of low back pain following a softball tournament. He describes a pain that has gradually gotten worse with a VAS score of 9 and best a 4. The pain is present throughout the day worse in the morning and at night and with activity. Bending kneeling lifting standing and rotation seemed to aggravate the pain and nothing much has given him relief except medication management and lying down. He had epidural steroids several months ago yielding no improvement in his pain and oral steroids were of no help. The pain is described as nagging constant dreadful with associated pain that wakes him up at night but no problems with bowel or bladder dysfunction. He occasionally has some low back cramping and occasional tingling to the right lower foot but this is mainly associated with long car rides or prolonged sitting. He did have a previous CT exam showing evidence of an L4-5 disc degeneration but was otherwise unremarkable with mild degenerative changes.  No significant changes are noted since his last visit. His last urine drug screen in April was appropriate. Quality characteristic and distribution of his pain have been otherwise unchanged.  History Dayln has a past medical history of Hypertension.   He has past surgical history that includes Hernia repair.   His family history includes Depression in his maternal grandmother; Diabetes in his paternal grandfather and paternal grandmother; Hypertension in his maternal grandmother, paternal grandfather, and paternal grandmother.He reports that he has been smoking Cigarettes.  He has been smoking about 0.50 packs per day. He does not have any smokeless tobacco history on file. He reports that he does not drink alcohol or use illicit  drugs.   ---------------------------------------------------------------------------------------------------------------------- Past Medical History  Diagnosis Date  . Hypertension     Past Surgical History  Procedure Laterality Date  . Hernia repair      as an infant    Family History  Problem Relation Age of Onset  . Depression Maternal Grandmother   . Hypertension Maternal Grandmother   . Hypertension Paternal Grandmother   . Diabetes Paternal Grandmother   . Hypertension Paternal Grandfather   . Diabetes Paternal Grandfather     Social History  Substance Use Topics  . Smoking status: Current Every Day Smoker -- 0.50 packs/day    Types: Cigarettes  . Smokeless tobacco: Not on file  . Alcohol Use: No    ---------------------------------------------------------------------------------------------------------------------- Social History   Social History  . Marital Status: Married    Spouse Name: N/A  . Number of Children: N/A  . Years of Education: N/A   Social History Main Topics  . Smoking status: Current Every Day Smoker -- 0.50 packs/day    Types: Cigarettes  . Smokeless tobacco: None  . Alcohol Use: No  . Drug Use: No  . Sexual Activity: Not Asked   Other Topics Concern  . None   Social History Narrative      ----------------------------------------------------------------------------------------------------------------------  ROS Review of Systems   Cardiac: High blood pressure Pulmonary: Smoker Neurologic: Negative GU: Negative   Objective:  BP 131/87 mmHg  Pulse 77  Temp(Src) 97.9 F (36.6 C) (Oral)  Resp 16  Ht 5\' 11"  (1.803 m)  Wt 255 lb (115.667 kg)  BMI 35.58 kg/m2  SpO2 99%  Physical Exam  Patient  is alert oriented cooperative compliant and a good historian Heart is regular rate and rhythm without murmur Lungs clear to auscultation Inspection of the low back reveals paraspinous muscle tenderness and pain with extension  and lateral rotation right greater than left in the standing position And this is without change from his last exam Negative straight leg raise bilaterally and generalized paraspinous muscle tenderness. Muscle tone and bulk is good     Assessment & Plan:   Willam was seen today for back pain.  Diagnoses and all orders for this visit:  Chronic midline low back pain without sciatica  Facet arthritis of lumbosacral region  DDD (degenerative disc disease), lumbar  Low back derangement syndrome  Other orders -     oxyCODONE-acetaminophen (PERCOCET) 5-325 MG tablet; Take 1 tablet by mouth every 4 (four) hours as needed for severe pain.     ----------------------------------------------------------------------------------------------------------------------  Problem List Items Addressed This Visit    None    Visit Diagnoses    Chronic midline low back pain without sciatica    -  Primary    Facet arthritis of lumbosacral region        Relevant Medications    oxyCODONE-acetaminophen (PERCOCET) 5-325 MG tablet    DDD (degenerative disc disease), lumbar        Relevant Medications    oxyCODONE-acetaminophen (PERCOCET) 5-325 MG tablet    Low back derangement syndrome        Relevant Medications    oxyCODONE-acetaminophen (PERCOCET) 5-325 MG tablet       ----------------------------------------------------------------------------------------------------------------------  1. Chronic midline low back pain without sciatica We will Keep him on Robaxin 750 mg 1 tablet at bedtime. He may increase to 2 tablets at bedtime in 1 week if no improvement in his lower back spasming - ToxASSURE Select 13 (MW), Urine  2. Facet arthritis of lumbosacral region We will defer on diagnostic facet injection until he has insurance secondary to cost.  3. DDD (degenerative disc disease), lumbar Stretching and strengthening exercises  4. Low back derangement syndrome Stretching strengthening  exercises with core strengthening and physical therapy as described today with return to clinic in 1 month He has also asked that we prescribed Percocet. We have had a long discussion regarding chronic use of opioids for nonmalignant pain. Based on our discussion and his desire to wean to a lower dose we are going to prescribe milligram tablets and he will reduce to 10 mg in the morning 10 mg in the evening and 5 mg at bedtime for 5 days and then reduced by 5 mg every 5 days until he reaches a 5 mg 3 times a day when necessary range. Hopefully within the next several weeks he will gain insurance for which we may proceed with a diagnostic facet block. Furthermore objective will be to reduce his opioid use to a when necessary basis over the course of the next 6 months.  ----------------------------------------------------------------------------------------------------------------------  I am having Mr. Toste maintain his LORazepam, amLODipine, dicloxacillin, methocarbamol, and oxyCODONE-acetaminophen.   Meds ordered this encounter  Medications  . oxyCODONE-acetaminophen (PERCOCET) 5-325 MG tablet    Sig: Take 1 tablet by mouth every 4 (four) hours as needed for severe pain.    Dispense:  150 tablet    Refill:  0    Pt to take 2 tabs tid with decreasing dose by one tablet every 5th day until baseline of 3 tabs per day/prn       Follow-up: Return in about 1 month (around 01/06/2016) for  evaluation, med refill.    Yevette EdwardsJames G Terral Cooks, MD

## 2015-12-24 ENCOUNTER — Ambulatory Visit: Payer: Self-pay | Attending: Anesthesiology | Admitting: Anesthesiology

## 2015-12-24 ENCOUNTER — Encounter: Payer: Self-pay | Admitting: Anesthesiology

## 2015-12-24 VITALS — BP 129/80 | HR 87 | Temp 98.0°F | Resp 16 | Ht 71.0 in | Wt 255.0 lb

## 2015-12-24 DIAGNOSIS — G8929 Other chronic pain: Secondary | ICD-10-CM | POA: Insufficient documentation

## 2015-12-24 DIAGNOSIS — M545 Low back pain, unspecified: Secondary | ICD-10-CM

## 2015-12-24 DIAGNOSIS — M5136 Other intervertebral disc degeneration, lumbar region: Secondary | ICD-10-CM | POA: Insufficient documentation

## 2015-12-24 DIAGNOSIS — M5386 Other specified dorsopathies, lumbar region: Secondary | ICD-10-CM

## 2015-12-24 DIAGNOSIS — M47817 Spondylosis without myelopathy or radiculopathy, lumbosacral region: Secondary | ICD-10-CM

## 2015-12-24 DIAGNOSIS — F17210 Nicotine dependence, cigarettes, uncomplicated: Secondary | ICD-10-CM | POA: Insufficient documentation

## 2015-12-24 DIAGNOSIS — I10 Essential (primary) hypertension: Secondary | ICD-10-CM | POA: Insufficient documentation

## 2015-12-24 DIAGNOSIS — Z6835 Body mass index (BMI) 35.0-35.9, adult: Secondary | ICD-10-CM | POA: Insufficient documentation

## 2015-12-24 MED ORDER — OXYCODONE-ACETAMINOPHEN 5-325 MG PO TABS: 1.0000 | ORAL_TABLET | ORAL | Status: DC | PRN

## 2015-12-24 NOTE — Progress Notes (Signed)
Patient here for medication management for percocet 5-325 mg.   Safety precautions to be maintained throughout the outpatient stay will include: orient to surroundings, keep bed in low position, maintain call bell within reach at all times, provide assistance with transfer out of bed and ambulation.

## 2015-12-24 NOTE — Progress Notes (Signed)
Subjective:  Patient ID: Brent Cuevas, male    DOB: 11-Apr-1986  Age: 30 y.o. MRN: [id number]  CC: Back Pain  Procedure: None   HPI Brent Cuevas presents for repeat evaluation. he has a 18 month history of low back pain following a softball tournament.He was last seen 1 month ago and is taking medications as prescribed for his low back pain. Her slowly reducing his total exposure on opioids as planned. Furthermore he is participating in a stretching strengthening physical therapy regimen seems to be helping. Due to insurance reasons he cannot undergo any interventional therapy however he desires to do this in time. No changes in the quality characteristic or distribution of his low back pain are noted at this point. Based on his narcotic assessment sheet he seems to be doing well with this regimen.  History Brent Cuevas has a past medical history of Hypertension.   He has past surgical history that includes Hernia repair.   His family history includes Depression in his maternal grandmother; Diabetes in his paternal grandfather and paternal grandmother; Hypertension in his maternal grandmother, paternal grandfather, and paternal grandmother.He reports that he has been smoking Cigarettes.  He has been smoking about 0.50 packs per day. He does not have any smokeless tobacco history on file. He reports that he does not drink alcohol or use illicit drugs.   ---------------------------------------------------------------------------------------------------------------------- Past Medical History  Diagnosis Date  . Hypertension     Past Surgical History  Procedure Laterality Date  . Hernia repair      as an infant    Family History  Problem Relation Age of Onset  . Depression Maternal Grandmother   . Hypertension Maternal Grandmother   . Hypertension Paternal Grandmother   . Diabetes Paternal Grandmother   . Hypertension Paternal Grandfather   . Diabetes Paternal  Grandfather     Social History  Substance Use Topics  . Smoking status: Current Every Day Smoker -- 0.50 packs/day    Types: Cigarettes  . Smokeless tobacco: Not on file  . Alcohol Use: No    ---------------------------------------------------------------------------------------------------------------------- Social History   Social History  . Marital Status: Married    Spouse Name: N/A  . Number of Children: N/A  . Years of Education: N/A   Social History Main Topics  . Smoking status: Current Every Day Smoker -- 0.50 packs/day    Types: Cigarettes  . Smokeless tobacco: None  . Alcohol Use: No  . Drug Use: No  . Sexual Activity: Not Asked   Other Topics Concern  . None   Social History Narrative      ----------------------------------------------------------------------------------------------------------------------  ROS Review of Systems   Cardiac: High blood pressure Pulmonary: Smoker Neurologic: Negative GU: Negative   Objective:  BP 129/80 mmHg  Pulse 87  Temp(Src) 98 F (36.7 C) (Oral)  Resp 16  Ht  (1.803 m)  Wt 255 lb (115.667 kg)  BMI 35.58 kg/m2  SpO2 100%  Physical Exam  Patient is alert oriented cooperative compliant and a good historian Heart is regular rate and rhythm without murmur He is ambulating well with appropriate range of motion at the low back  Assessment & Plan:   Brent Cuevas was seen today for back pain.  Diagnoses and all orders for this visit:  Chronic midline low back pain without sciatica  Facet arthritis of lumbosacral region  DDD (degenerative disc disease), lumbar  Low back derangement syndrome  Other orders -     oxyCODONE-acetaminophen (PERCOCET) 5-325 MG tablet; Take 1  tablet by mouth every 4 (four) hours as needed for severe pain.     ----------------------------------------------------------------------------------------------------------------------  Problem List Items Addressed This  Visit    None    Visit Diagnoses    Chronic midline low back pain without sciatica    -  Primary    Facet arthritis of lumbosacral region        Relevant Medications    oxyCODONE-acetaminophen (PERCOCET) 5-325 MG tablet    DDD (degenerative disc disease), lumbar        Relevant Medications    oxyCODONE-acetaminophen (PERCOCET) 5-325 MG tablet    Low back derangement syndrome        Relevant Medications    oxyCODONE-acetaminophen (PERCOCET) 5-325 MG tablet       ----------------------------------------------------------------------------------------------------------------------  1. Chronic midline low back pain without sciatica We will Keep him on Robaxin 750 mg 1 tablet at bedtime. He may increase to 2 tablets at bedtime in 1 week if no improvement in his lower back spasming - ToxASSURE Select 13 (MW), Urine  2. Facet arthritis of lumbosacral region We will defer on diagnostic facet injection until he has insurance secondary to cost.  3. DDD (degenerative disc disease), lumbar Stretching and strengthening exercises  4. Low back derangement syndrome Stretching strengthening exercises with core strengthening and physical therapy as described today with return to clinic in 1 month Once again we have talked about his opioid medication regimen and we will continue with 2 hydrocodone in the morning and 2 in the afternoon and 1 at bedtime for this next month and hope to wean that down to 4 tablets per day for the next 2 months and use NSAIDs for breakthrough pain 5 days per week. Also had a long discussion with him regarding stretching strengthening exercises once again ----------------------------------------------------------------------------------------------------------------------  I am having Mr. Brent Cuevas maintain his LORazepam, amLODipine, dicloxacillin, methocarbamol, and oxyCODONE-acetaminophen.   Meds ordered this encounter  Medications  . oxyCODONE-acetaminophen (PERCOCET)  5-325 MG tablet    Sig: Take 1 tablet by mouth every 4 (four) hours as needed for severe pain.    Dispense:  150 tablet    Refill:  0    Do not fill until [id number] Pt to take 2 tabs tid with decreasing dose by one tablet every 5th day until baseline of 3 tabs per day/prn       Follow-up: Return in about 1 month (around 01/23/2016) for evaluation, med refill.    Yevette EdwardsJames G Adams, MD

## 2016-02-01 ENCOUNTER — Encounter: Payer: Self-pay | Admitting: Anesthesiology

## 2016-02-01 ENCOUNTER — Ambulatory Visit: Payer: Self-pay | Attending: Anesthesiology | Admitting: Anesthesiology

## 2016-02-01 VITALS — BP 143/96 | HR 75 | Temp 98.7°F | Resp 16 | Ht 70.0 in | Wt 255.0 lb

## 2016-02-01 DIAGNOSIS — M47817 Spondylosis without myelopathy or radiculopathy, lumbosacral region: Secondary | ICD-10-CM

## 2016-02-01 DIAGNOSIS — M5136 Other intervertebral disc degeneration, lumbar region: Secondary | ICD-10-CM

## 2016-02-01 DIAGNOSIS — M5386 Other specified dorsopathies, lumbar region: Secondary | ICD-10-CM

## 2016-02-01 DIAGNOSIS — G8929 Other chronic pain: Secondary | ICD-10-CM

## 2016-02-01 DIAGNOSIS — M545 Low back pain, unspecified: Secondary | ICD-10-CM

## 2016-02-01 DIAGNOSIS — R52 Pain, unspecified: Secondary | ICD-10-CM | POA: Insufficient documentation

## 2016-02-01 MED ORDER — OXYCODONE HCL 5 MG PO TABS: 5.0000 mg | ORAL_TABLET | Freq: Four times a day (QID) | ORAL | Status: DC | PRN

## 2016-02-01 NOTE — Patient Instructions (Signed)
Smoking Cessation, Tips for Success If you are ready to quit smoking, congratulations! You have chosen to help yourself be healthier. Cigarettes bring nicotine, tar, carbon monoxide, and other irritants into your body. Your lungs, heart, and blood vessels will be able to work better without these poisons. There are many different ways to quit smoking. Nicotine gum, nicotine patches, a nicotine inhaler, or nicotine nasal spray can help with physical craving. Hypnosis, support groups, and medicines help break the habit of smoking. WHAT THINGS CAN I DO TO MAKE QUITTING EASIER?  Here are some tips to help you quit for good:  Pick a date when you will quit smoking completely. Tell all of your friends and family about your plan to quit on that date.  Do not try to slowly cut down on the number of cigarettes you are smoking. Pick a quit date and quit smoking completely starting on that day.  Throw away all cigarettes.   Clean and remove all ashtrays from your home, work, and car.  On a card, write down your reasons for quitting. Carry the card with you and read it when you get the urge to smoke.  Cleanse your body of nicotine. Drink enough water and fluids to keep your urine clear or pale yellow. Do this after quitting to flush the nicotine from your body.  Learn to predict your moods. Do not let a bad situation be your excuse to have a cigarette. Some situations in your life might tempt you into wanting a cigarette.  Never have "just one" cigarette. It leads to wanting another and another. Remind yourself of your decision to quit.  Change habits associated with smoking. If you smoked while driving or when feeling stressed, try other activities to replace smoking. Stand up when drinking your coffee. Brush your teeth after eating. Sit in a different chair when you read the paper. Avoid alcohol while trying to quit, and try to drink fewer caffeinated beverages. Alcohol and caffeine may urge you to  smoke.  Avoid foods and drinks that can trigger a desire to smoke, such as sugary or spicy foods and alcohol.  Ask people who smoke not to smoke around you.  Have something planned to do right after eating or having a cup of coffee. For example, plan to take a walk or exercise.  Try a relaxation exercise to calm you down and decrease your stress. Remember, you may be tense and nervous for the first 2 weeks after you quit, but this will pass.  Find new activities to keep your hands busy. Play with a pen, coin, or rubber band. Doodle or draw things on paper.  Brush your teeth right after eating. This will help cut down on the craving for the taste of tobacco after meals. You can also try mouthwash.   Use oral substitutes in place of cigarettes. Try using lemon drops, carrots, cinnamon sticks, or chewing gum. Keep them handy so they are available when you have the urge to smoke.  When you have the urge to smoke, try deep breathing.  Designate your home as a nonsmoking area.  If you are a heavy smoker, ask your health care provider about a prescription for nicotine chewing gum. It can ease your withdrawal from nicotine.  Reward yourself. Set aside the cigarette money you save and buy yourself something nice.  Look for support from others. Join a support group or smoking cessation program. Ask someone at home or at work to help you with your plan   to quit smoking.  Always ask yourself, "Do I need this cigarette or is this just a reflex?" Tell yourself, "Today, I choose not to smoke," or "I do not want to smoke." You are reminding yourself of your decision to quit.  Do not replace cigarette smoking with electronic cigarettes (commonly called e-cigarettes). The safety of e-cigarettes is unknown, and some may contain harmful chemicals.  If you relapse, do not give up! Plan ahead and think about what you will do the next time you get the urge to smoke. HOW WILL I FEEL WHEN I QUIT SMOKING? You  may have symptoms of withdrawal because your body is used to nicotine (the addictive substance in cigarettes). You may crave cigarettes, be irritable, feel very hungry, cough often, get headaches, or have difficulty concentrating. The withdrawal symptoms are only temporary. They are strongest when you first quit but will go away within 10-14 days. When withdrawal symptoms occur, stay in control. Think about your reasons for quitting. Remind yourself that these are signs that your body is healing and getting used to being without cigarettes. Remember that withdrawal symptoms are easier to treat than the major diseases that smoking can cause.  Even after the withdrawal is over, expect periodic urges to smoke. However, these cravings are generally short lived and will go away whether you smoke or not. Do not smoke! WHAT RESOURCES ARE AVAILABLE TO HELP ME QUIT SMOKING? Your health care provider can direct you to community resources or hospitals for support, which may include:  Group support.  Education.  Hypnosis.  Therapy.   This information is not intended to replace advice given to you by your health care provider. Make sure you discuss any questions you have with your health care provider.   Document Released: 04/07/2004 Document Revised: 07/31/2014 Document Reviewed: 12/26/2012 Elsevier Interactive Patient Education 2016 [employer]  

## 2016-02-01 NOTE — Progress Notes (Signed)
Safety precautions to be maintained throughout the outpatient stay will include: orient to surroundings, keep bed in low position, maintain call bell within reach at all times, provide assistance with transfer out of bed and ambulation.  

## 2016-03-08 ENCOUNTER — Ambulatory Visit: Payer: Self-pay | Attending: Anesthesiology | Admitting: Anesthesiology

## 2016-03-08 ENCOUNTER — Encounter: Payer: Self-pay | Admitting: Anesthesiology

## 2016-03-08 VITALS — BP 123/79 | HR 80 | Temp 98.1°F | Resp 16 | Ht 71.0 in | Wt 250.0 lb

## 2016-03-08 DIAGNOSIS — I10 Essential (primary) hypertension: Secondary | ICD-10-CM | POA: Insufficient documentation

## 2016-03-08 DIAGNOSIS — M47817 Spondylosis without myelopathy or radiculopathy, lumbosacral region: Secondary | ICD-10-CM | POA: Insufficient documentation

## 2016-03-08 DIAGNOSIS — M5386 Other specified dorsopathies, lumbar region: Secondary | ICD-10-CM

## 2016-03-08 DIAGNOSIS — G8929 Other chronic pain: Secondary | ICD-10-CM | POA: Insufficient documentation

## 2016-03-08 DIAGNOSIS — M545 Low back pain, unspecified: Secondary | ICD-10-CM

## 2016-03-08 DIAGNOSIS — M5136 Other intervertebral disc degeneration, lumbar region: Secondary | ICD-10-CM | POA: Insufficient documentation

## 2016-03-08 DIAGNOSIS — Z9889 Other specified postprocedural states: Secondary | ICD-10-CM | POA: Insufficient documentation

## 2016-03-08 DIAGNOSIS — F17210 Nicotine dependence, cigarettes, uncomplicated: Secondary | ICD-10-CM | POA: Insufficient documentation

## 2016-03-08 MED ORDER — OXYCODONE HCL 5 MG PO TABS: 5.0000 mg | ORAL_TABLET | Freq: Four times a day (QID) | ORAL | 0 refills | Status: DC | PRN

## 2016-03-08 NOTE — Patient Instructions (Signed)
A prescription for OXYCODONE X 2 was given to you today.

## 2016-03-08 NOTE — Progress Notes (Signed)
Safety precautions to be maintained throughout the outpatient stay will include: orient to surroundings, keep bed in low position, maintain call bell within reach at all times, provide assistance with transfer out of bed and ambulation.  

## 2016-03-10 NOTE — Progress Notes (Signed)
Subjective:  Patient ID: Brent FlashChristopher S Dolle, male    DOB: [birth year]  Age: 30 y.o. MRN: [id number]  CC: Back Pain (lower)  Procedure: None   HPI Brent FlashChristopher S Cuevas presents for repeat evaluation. he has a 18 month history of low back pain following a softball tournament.He was last seen 1 month ago and is taking medications as prescribed for his low back pain.He has been trying to decrease the frequency of his medication management however is having consistent breakthrough pain in the early evening and late evening. This pain is of the same quality characteristic and distribution in the low back and is impairing his sleep and generalized functioning. The medica. He is still trying to do his stretching strengthening exercises as best tolerated and would ultimately like to undergo interventional therapy when his insurance status is improved.tions have been well tolerated  History Brent Cuevas has a past medical history of Hypertension.   He has a past surgical history that includes Hernia repair.   His family history includes Depression in his maternal grandmother; Diabetes in his paternal grandfather and paternal grandmother; Hypertension in his maternal grandmother, paternal grandfather, and paternal grandmother.He reports that he has been smoking Cigarettes.  He has been smoking about 0.50 packs per day. He does not have any smokeless tobacco history on file. He reports that he does not drink alcohol or use drugs.   ---------------------------------------------------------------------------------------------------------------------- Past Medical History:  Diagnosis Date  . Hypertension     Past Surgical History:  Procedure Laterality Date  . HERNIA REPAIR     as an infant    Family History  Problem Relation Age of Onset  . Depression Maternal Grandmother   . Hypertension Maternal Grandmother   . Hypertension Paternal Grandmother   . Diabetes Paternal Grandmother   .  Hypertension Paternal Grandfather   . Diabetes Paternal Grandfather     Social History  Substance Use Topics  . Smoking status: Current Every Day Smoker    Packs/day: 0.50    Types: Cigarettes  . Smokeless tobacco: Not on file  . Alcohol use No    ---------------------------------------------------------------------------------------------------------------------- Social History   Social History  . Marital status: Married    Spouse name: N/A  . Number of children: N/A  . Years of education: N/A   Social History Main Topics  . Smoking status: Current Every Day Smoker    Packs/day: 0.50    Types: Cigarettes  . Smokeless tobacco: None  . Alcohol use No  . Drug use: No  . Sexual activity: Not Asked   Other Topics Concern  . None   Social History Narrative  . None      ----------------------------------------------------------------------------------------------------------------------  ROS Review of Systems   Cardiac: High blood pressure Pulmonary: Smoker Neurologic: Negative GU: Negative   Objective:  BP 123/79   Pulse 80   Temp 98.1 F (36.7 C) (Oral)   Resp 16   Ht 5\' 11"  (1.803 m)   Wt 250 lb (113.4 kg)   SpO2 98%   BMI 34.87 kg/m   Physical Exam  Patient is alert oriented cooperative compliant and a good historian Heart is regular rate and rhythm without murmur He is ambulating well with appropriate range of motion at the low backStill with considerable pain on extension with both left and right lateral rotation  Assessment & Plan:   There are no diagnoses linked to this encounter.   ----------------------------------------------------------------------------------------------------------------------  Problem List Items Addressed This Visit    None    Visit  Diagnoses   None.     ----------------------------------------------------------------------------------------------------------------------  1. Chronic midline low back pain  without sciatica We will Keep him on Robaxin 750 mg 1 tablet at bedtime. He may increase to 2 tablets at bedtime in 1 week if no improvement in his lower back spasming - ToxASSURE Select 13 (MW), Urine  2. Facet arthritis of lumbosacral region We will defer on diagnostic facet injection until he has insurance secondary to cost.  3. DDD (degenerative disc disease), lumbar Stretching and strengthening exercises  4. Low back derangement syndrome Stretching strengthening exercises with core strengthening and physical therapy as described today with return to clinic in 1 month Once again we have talked about his opioid medication regimen and we will continue with 2 hydrocodone in the morning 1 in the afternoon and 1 in the evening. He has been compliant with his medication management with no evidence of diversion and we'll span him out to every 2 month evaluation. Prescriptions were given for this month and next month ----------------------------------------------------------------------------------------------------------------------  I am having Mr. [name] maintain his LORazepam, amLODipine, dicloxacillin, methocarbamol, oxyCODONE-acetaminophen, and oxyCODONE.   Meds ordered this encounter  Medications  . DISCONTD: oxyCODONE (ROXICODONE) 5 MG immediate release tablet    Sig: Take 1 tablet (5 mg total) by mouth every 6 (six) hours as needed for severe pain.    Dispense:  120 tablet    Refill:  0  . oxyCODONE (ROXICODONE) 5 MG immediate release tablet    Sig: Take 1 tablet (5 mg total) by mouth every 6 (six) hours as needed for severe pain.    Dispense:  120 tablet    Refill:  0    Do not fill until [id number]       Follow-up: Return in about 2 months (around 05/08/2016) for evaluation, med refill.    Yevette EdwardsJames G Chaze Hruska, MD

## 2016-03-10 NOTE — Progress Notes (Signed)
Subjective:  Patient ID: Brent Cuevas, male    DOB: 1985/08/26  Age: 30 y.o. MRN: [id number]  CC: Back Pain (low)  Procedure: None   HPI Brent FlashChristopher S Ribas presents for repeat evaluation. he has a 18 month history of low back pain following a softball tournament.He was last seen 1 month ago and is taking medications as prescribed for his low back pain. He has been slowly reducing his total exposure on opioids as planned. Furthermore he is participating in a stretching strengthening physical therapy regimen seems to be helping. Due to insurance reasons he cannot undergo any interventional therapy however he desires to do this in time. No changes in the quality characteristic or distribution of his low back pain are noted at this point. Based on his narcotic assessment sheet he seems to be doing well with this regimen.  History Brent Cuevas has a past medical history of Hypertension.   He has a past surgical history that includes Hernia repair.   His family history includes Depression in his maternal grandmother; Diabetes in his paternal grandfather and paternal grandmother; Hypertension in his maternal grandmother, paternal grandfather, and paternal grandmother.He reports that he has been smoking Cigarettes.  He has been smoking about 0.50 packs per day. He does not have any smokeless tobacco history on file. He reports that he does not drink alcohol or use drugs.   ---------------------------------------------------------------------------------------------------------------------- Past Medical History:  Diagnosis Date  . Hypertension     Past Surgical History:  Procedure Laterality Date  . HERNIA REPAIR     as an infant    Family History  Problem Relation Age of Onset  . Depression Maternal Grandmother   . Hypertension Maternal Grandmother   . Hypertension Paternal Grandmother   . Diabetes Paternal Grandmother   . Hypertension Paternal Grandfather   . Diabetes  Paternal Grandfather     Social History  Substance Use Topics  . Smoking status: Current Every Day Smoker    Packs/day: 0.50    Types: Cigarettes  . Smokeless tobacco: Not on file  . Alcohol use No    ---------------------------------------------------------------------------------------------------------------------- Social History   Social History  . Marital status: Married    Spouse name: N/A  . Number of children: N/A  . Years of education: N/A   Social History Main Topics  . Smoking status: Current Every Day Smoker    Packs/day: 0.50    Types: Cigarettes  . Smokeless tobacco: None  . Alcohol use No  . Drug use: No  . Sexual activity: Not Asked   Other Topics Concern  . None   Social History Narrative  . None      ----------------------------------------------------------------------------------------------------------------------  ROS Review of Systems   Cardiac: High blood pressure Pulmonary: Smoker Neurologic: Negative GU: Negative   Objective:  BP (!) 143/96   Pulse 75   Temp 98.7 F (37.1 C)   Resp 16   Ht 5\' 10"  (1.778 m)   Wt 255 lb (115.7 kg)   SpO2 99%   BMI 36.59 kg/m   Physical Exam  Patient is alert oriented cooperative compliant and a good historian Heart is regular rate and rhythm without murmur He is ambulating well with appropriate range of motion at the low backStill with considerable pain on extension with both left and right lateral rotation  Assessment & Plan:   Brent Cuevas was seen today for back pain.  Diagnoses and all orders for this visit:  Chronic midline low back pain without sciatica  Facet arthritis of  lumbosacral region  DDD (degenerative disc disease), lumbar  Low back derangement syndrome  Other orders -     Discontinue: oxyCODONE (ROXICODONE) 5 MG immediate release tablet; Take 1 tablet (5 mg total) by mouth every 6 (six) hours as needed for severe  pain.     ----------------------------------------------------------------------------------------------------------------------  Problem List Items Addressed This Visit    None    Visit Diagnoses    Chronic midline low back pain without sciatica    -  Primary   Facet arthritis of lumbosacral region       DDD (degenerative disc disease), lumbar       Low back derangement syndrome          ----------------------------------------------------------------------------------------------------------------------  1. Chronic midline low back pain without sciatica We will Keep him on Robaxin 750 mg 1 tablet at bedtime. He may increase to 2 tablets at bedtime in 1 week if no improvement in his lower back spasming - ToxASSURE Select 13 (MW), Urine  2. Facet arthritis of lumbosacral region We will defer on diagnostic facet injection until he has insurance secondary to cost.  3. DDD (degenerative disc disease), lumbar Stretching and strengthening exercises  4. Low back derangement syndrome Stretching strengthening exercises with core strengthening and physical therapy as described today with return to clinic in 1 month Once again we have talked about his opioid medication regimen and we will continue with 2 hydrocodone in the morning and 2 in the afternoon . ----------------------------------------------------------------------------------------------------------------------  I am having Brent Cuevas maintain his LORazepam, amLODipine, dicloxacillin, methocarbamol, and oxyCODONE-acetaminophen.   Meds ordered this encounter  Medications  . DISCONTD: oxyCODONE (ROXICODONE) 5 MG immediate release tablet    Sig: Take 1 tablet (5 mg total) by mouth every 6 (six) hours as needed for severe pain.    Dispense:  90 tablet    Refill:  0    Do not fill until [id number]       Follow-up: Return in about 1 month (around 03/03/2016) for evaluation, med refill.    Yevette EdwardsJames G Adams, MD

## 2016-04-04 ENCOUNTER — Emergency Department
Admission: EM | Admit: 2016-04-04 | Discharge: 2016-04-04 | Disposition: A | Discharge status: Home / Self Care | Payer: Self-pay | Attending: Emergency Medicine | Admitting: Emergency Medicine

## 2016-04-04 ENCOUNTER — Encounter: Payer: Self-pay | Admitting: Emergency Medicine

## 2016-04-04 DIAGNOSIS — L739 Follicular disorder, unspecified: Secondary | ICD-10-CM | POA: Insufficient documentation

## 2016-04-04 DIAGNOSIS — L0291 Cutaneous abscess, unspecified: Secondary | ICD-10-CM

## 2016-04-04 DIAGNOSIS — F17210 Nicotine dependence, cigarettes, uncomplicated: Secondary | ICD-10-CM | POA: Insufficient documentation

## 2016-04-04 DIAGNOSIS — I10 Essential (primary) hypertension: Secondary | ICD-10-CM | POA: Insufficient documentation

## 2016-04-04 DIAGNOSIS — L02211 Cutaneous abscess of abdominal wall: Secondary | ICD-10-CM | POA: Insufficient documentation

## 2016-04-04 HISTORY — DX: Other intervertebral disc displacement, lumbar region: M51.26

## 2016-04-04 MED ORDER — CEPHALEXIN 500 MG PO CAPS
500.0000 mg | ORAL_CAPSULE | Freq: Once | ORAL | Status: AC
  Administered 2016-04-04: 500 mg via ORAL

## 2016-04-04 MED ORDER — CEPHALEXIN 500 MG PO CAPS
ORAL_CAPSULE | ORAL | Status: AC
  Administered 2016-04-04: 500 mg via ORAL
  Filled 2016-04-04: qty 1

## 2016-04-04 MED ORDER — CEPHALEXIN 500 MG PO CAPS: 500.0000 mg | ORAL_CAPSULE | Freq: Two times a day (BID) | ORAL | 0 refills | Status: AC

## 2016-04-04 NOTE — ED Notes (Signed)
MD at bedside. 

## 2016-04-04 NOTE — ED Provider Notes (Signed)
[facility] Emergency Department Provider Note   First MD Initiated Contact with Patient 04/04/16 0350     (approximate)  I have reviewed the triage vital signs and the nursing notes.   HISTORY  Chief Complaint Abscess   HPI Brent Cuevas is a 30 y.o. male presents with 2 weeks history of a "knot on his stomach which she believes to be an ingrown hair. Patient states that he popped the area with a needle and greenish yellow stuff came out. Patient states he pulled the scab off and subsequently white drainage came out. Patient denies any fever afebrile on presentation with a temperature 98.2.   Past Medical History:  Diagnosis Date  . Hypertension   . Lumbar herniated disc     There are no active problems to display for this patient.   Past Surgical History:  Procedure Laterality Date  . HERNIA REPAIR     as an infant    Prior to Admission medications   Medication Sig Start Date End Date Taking? Authorizing Provider  amLODipine (NORVASC) 5 MG tablet Take 5 mg by mouth daily.    Historical Provider, MD  cephALEXin (KEFLEX) 500 MG capsule Take 1 capsule (500 mg total) by mouth 2 (two) times daily. 04/04/16 04/14/16  Darci Currentandolph N Brown, MD  dicloxacillin (DYNAPEN) 250 MG capsule Take 250 mg by mouth 4 (four) times daily as needed. Reported on 02/01/2016    Historical Provider, MD  LORazepam (ATIVAN) 1 MG tablet Take 1 mg by mouth every 8 (eight) hours as needed for anxiety.    Historical Provider, MD  methocarbamol (ROBAXIN) 750 MG tablet Take 1 tablet (750 mg total) by mouth 2 (two) times daily. 10/26/15   Yevette EdwardsJames G Adams, MD  oxyCODONE (ROXICODONE) 5 MG immediate release tablet Take 1 tablet (5 mg total) by mouth every 6 (six) hours as needed for severe pain. 03/08/16   Yevette EdwardsJames G Adams, MD  oxyCODONE-acetaminophen (PERCOCET) 5-325 MG tablet Take 1 tablet by mouth every 4 (four) hours as needed for severe pain. Patient not taking: Reported on 03/08/2016  12/24/15   Yevette EdwardsJames G Adams, MD    Allergies No known drug allergies  Family History  Problem Relation Age of Onset  . Depression Maternal Grandmother   . Hypertension Maternal Grandmother   . Hypertension Paternal Grandmother   . Diabetes Paternal Grandmother   . Hypertension Paternal Grandfather   . Diabetes Paternal Grandfather     Social History Social History  Substance Use Topics  . Smoking status: Current Every Day Smoker    Packs/day: 1.00    Types: Cigarettes  . Smokeless tobacco: Never Used  . Alcohol use No    Review of Systems Constitutional: No fever/chills Eyes: No visual changes. ENT: No sore throat. Cardiovascular: Denies chest pain. Respiratory: Denies shortness of breath. Gastrointestinal: No abdominal pain.  No nausea, no vomiting.  No diarrhea.  No constipation. Genitourinary: Negative for dysuria. Musculoskeletal: Negative for back pain. Skin: Negative for rash.Positive for "knot on stomach" Neurological: Negative for headaches, focal weakness or numbness.  10-point ROS otherwise negative.  ____________________________________________   PHYSICAL EXAM:  VITAL SIGNS: ED Triage Vitals  Enc Vitals Group     BP 04/04/16 0156 (!) 141/90     Pulse Rate 04/04/16 0156 60     Resp 04/04/16 0156 18     Temp 04/04/16 0156 98.2 F (36.8 C)     Temp Source 04/04/16 0156 Oral     SpO2 04/04/16 0156  97 %     Weight 04/04/16 0201 250 lb (113.4 kg)     Height 04/04/16 0201 5\' 11"  (1.803 m)     Head Circumference --      Peak Flow --      Pain Score 04/04/16 0407 4     Pain Loc --      Pain Edu? --      Excl. in GC? --     Constitutional: Alert and oriented. Well appearing and in no acute distress. Eyes: Conjunctivae are normal. PERRL. EOMI. Head: Atraumatic. Musculoskeletal: No lower extremity tenderness nor edema. No gross deformities of extremities. Neurologic:  Normal speech and language. No gross focal neurologic deficits are appreciated.  Skin:   2 cm area of erythema with the pain size hole in the center area      Procedures    INITIAL IMPRESSION / ASSESSMENT AND PLAN / ED COURSE  Pertinent labs & imaging results that were available during my care of the patient were reviewed by me and considered in my medical decision making (see chart for details).  No I&D performed patient given Keflex and will be prescribed same for home.   Clinical Course    ____________________________________________  FINAL CLINICAL IMPRESSION(S) / ED DIAGNOSES  Final diagnoses:  Abscess  Folliculitis     MEDICATIONS GIVEN DURING THIS VISIT:  Medications  cephALEXin (KEFLEX) capsule 500 mg (not administered)     NEW OUTPATIENT MEDICATIONS STARTED DURING THIS VISIT:  New Prescriptions   CEPHALEXIN (KEFLEX) 500 MG CAPSULE    Take 1 capsule (500 mg total) by mouth 2 (two) times daily.    Modified Medications   No medications on file    Discontinued Medications   No medications on file     Note:  This document was prepared using Dragon voice recognition software and may include unintentional dictation errors.    Darci Current, MD 04/04/16 [id number]

## 2016-04-04 NOTE — ED Notes (Signed)
Discharge instructions reviewed with patient. Questions fielded by this RN. Patient verbalizes understanding of instructions. Patient discharged home in stable condition per Brown MD . No acute distress noted at time of discharge.   

## 2016-04-04 NOTE — ED Triage Notes (Addendum)
Pt present to ED pt reports 2 weeks ago noticed know in stomach, pt believed it was an ingrown hair. Pt reports a week ago, "popped the knot with a needle and it greenish yellow stuff came out." Pt reports pulled scab off and had white drainage. Small puncture wound noted to right lower abdomen. No drainage or blood noted. Pt alert and oriented x 4.

## 2016-05-04 ENCOUNTER — Ambulatory Visit: Payer: Self-pay | Attending: Anesthesiology | Admitting: Anesthesiology

## 2016-05-04 ENCOUNTER — Encounter: Payer: Self-pay | Admitting: Anesthesiology

## 2016-05-04 VITALS — BP 126/74 | HR 70 | Temp 98.6°F | Resp 18 | Ht 70.0 in | Wt 245.0 lb

## 2016-05-04 DIAGNOSIS — Z79891 Long term (current) use of opiate analgesic: Secondary | ICD-10-CM | POA: Insufficient documentation

## 2016-05-04 DIAGNOSIS — M4697 Unspecified inflammatory spondylopathy, lumbosacral region: Secondary | ICD-10-CM

## 2016-05-04 DIAGNOSIS — M545 Low back pain, unspecified: Secondary | ICD-10-CM

## 2016-05-04 DIAGNOSIS — M5386 Other specified dorsopathies, lumbar region: Secondary | ICD-10-CM

## 2016-05-04 DIAGNOSIS — M1388 Other specified arthritis, other site: Secondary | ICD-10-CM | POA: Insufficient documentation

## 2016-05-04 DIAGNOSIS — I10 Essential (primary) hypertension: Secondary | ICD-10-CM | POA: Insufficient documentation

## 2016-05-04 DIAGNOSIS — M47817 Spondylosis without myelopathy or radiculopathy, lumbosacral region: Secondary | ICD-10-CM

## 2016-05-04 DIAGNOSIS — G8929 Other chronic pain: Secondary | ICD-10-CM | POA: Insufficient documentation

## 2016-05-04 DIAGNOSIS — M5136 Other intervertebral disc degeneration, lumbar region: Secondary | ICD-10-CM | POA: Insufficient documentation

## 2016-05-04 DIAGNOSIS — F17210 Nicotine dependence, cigarettes, uncomplicated: Secondary | ICD-10-CM | POA: Insufficient documentation

## 2016-05-04 MED ORDER — OXYCODONE HCL 5 MG PO TABS: 5.0000 mg | ORAL_TABLET | Freq: Four times a day (QID) | ORAL | 0 refills | Status: DC | PRN

## 2016-05-04 NOTE — Patient Instructions (Signed)
Trigger Point Injection Trigger points are areas where you have muscle pain. A trigger point injection is a shot given in the trigger point to relieve that pain. A trigger point might feel like a knot in your muscle. It hurts to press on a trigger point. Sometimes the pain spreads out (radiates) to other parts of the body. For example, pressing on a trigger point in your shoulder might cause pain in your arm or neck. You might have one trigger point. Or, you might have more than one. People often have trigger points in their upper back and lower back. They also occur often in the neck and shoulders. Pain from a trigger point lasts for a long time. It can make it hard to keep moving. You might not be able to do the exercise or physical therapy that could help you deal with the pain. A trigger point injection may help. It does not work for everyone. But, it may relieve your pain for a few days or a few months. A trigger point injection does not cure long-lasting (chronic) pain. LET YOUR CAREGIVER KNOW ABOUT:  Any allergies (especially to latex, lidocaine, or steroids).  Blood-thinning medicines that you take. These drugs can lead to bleeding or bruising after an injection. They include:  Aspirin.  Ibuprofen.  Clopidogrel.  Warfarin.  Other medicines you take. This includes all vitamins, herbs, eyedrops, over-the-counter medicines, and creams.  Use of steroids.  Recent infections.  Past problems with numbing medicines.  Bleeding problems.  Surgeries you have had.  Other health problems. RISKS AND COMPLICATIONS A trigger point injection is a safe treatment. However, problems may develop, such as:  Minor side effects usually go away in 1 to 2 days. These may include:  Soreness.  Bruising.  Stiffness.  More serious problems are rare. But, they may include:  Bleeding under the skin (hematoma).  Skin infection.  Breaking off of the needle under your skin.  Lung  puncture.  The trigger point injection may not work for you. BEFORE THE PROCEDURE You may need to stop taking any medicine that thins your blood. This is to prevent bleeding and bruising. Usually these medicines are stopped several days before the injection. No other preparation is needed. PROCEDURE  A trigger point injection can be given in your caregiver's office or in a clinic. Each injection takes 2 minutes or less.  Your caregiver will feel for trigger points. The caregiver may use a marker to circle the area for the injection.  The skin over the trigger point will be washed with a germ-killing (antiseptic) solution.  The caregiver pinches the spot for the injection.  Then, a very thin needle is used for the shot. You may feel pain or a twitching feeling when the needle enters the trigger point.  A numbing solution may be injected into the trigger point. Sometimes a drug to keep down swelling, redness, and warmth (inflammation) is also injected.  Your caregiver moves the needle around the trigger zone until the tightness and twitching goes away.  After the injection, your caregiver may put gentle pressure over the injection site.  Then it is covered with a bandage. AFTER THE PROCEDURE  You can go right home after the injection.  The bandage can be taken off after a few hours.  You may feel sore and stiff for 1 to 2 days.  Go back to your regular activities slowly. Your caregiver may ask you to stretch your muscles. Do not do anything that takes   extra energy for a few days.  Follow your caregiver's instructions to manage and treat other pain.   This information is not intended to replace advice given to you by your health care provider. Make sure you discuss any questions you have with your health care provider.   Document Released: 06/29/2011 Document Revised: 11/04/2012 Document Reviewed: 06/29/2011 Elsevier Interactive Patient Education 2016 Elsevier Inc. Pain  Management Discharge Instructions  General Discharge Instructions :  If you need to reach your doctor call: Monday-Friday 8:00 am - 4:00 pm at [phone] or toll free [phone].  After clinic hours [phone] to have operator reach doctor.  Bring all of your medication bottles to all your appointments in the pain clinic.  To cancel or reschedule your appointment with Pain Management please remember to call 24 hours in advance to avoid a fee.  Refer to the educational materials which you have been given on: General Risks, I had my Procedure. Discharge Instructions, Post Sedation.  Post Procedure Instructions:  The drugs you were given will stay in your system until tomorrow, so for the next 24 hours you should not drive, make any legal decisions or drink any alcoholic beverages.  You may eat anything you prefer, but it is better to start with liquids then soups and crackers, and gradually work up to solid foods.  Please notify your doctor immediately if you have any unusual bleeding, trouble breathing or pain that is not related to your normal pain.  Depending on the type of procedure that was done, some parts of your body may feel week and/or numb.  This usually clears up by tonight or the next day.  Walk with the use of an assistive device or accompanied by an adult for the 24 hours.  You may use ice on the affected area for the first 24 hours.  Put ice in a Ziploc bag and cover with a towel and place against area 15 minutes on 15 minutes off.  You may switch to heat after 24 hours.Facet Joint Block The facet joints connect the bones of the spine (vertebrae). They make it possible for you to bend, twist, and make other movements with your spine. They also prevent you from overbending, overtwisting, and making other excessive movements.  A facet joint block is a procedure where a numbing medicine (anesthetic) is injected into a facet joint. Often, a type of anti-inflammatory  medicine called a steroid is also injected. A facet joint block may be done for two reasons:   Diagnosis. A facet joint block may be done as a test to see whether neck or back pain is caused by a worn-down or infected facet joint. If the pain gets better after a facet joint block, it means the pain is probably coming from the facet joint. If the pain does not get better, it means the pain is probably not coming from the facet joint.   Therapy. A facet joint block may be done to relieve neck or back pain caused by a facet joint. A facet joint block is only done as a therapy if the pain does not improve with medicine, exercise programs, physical therapy, and other forms of pain management. LET [facility] CARE PROVIDER KNOW ABOUT:   Any allergies you have.   All medicines you are taking, including vitamins, herbs, eyedrops, and over-the-counter medicines and creams.   Previous problems you or members of your family have had with the use of anesthetics.   Any blood disorders you have had.  Other health problems you have. RISKS AND COMPLICATIONS Generally, having a facet joint block is safe. However, as with any procedure, complications can occur. Possible complications associated with having a facet joint block include:   Bleeding.   Injury to a nerve near the injection site.   Pain at the injection site.   Weakness or numbness in areas controlled by nerves near the injection site.   Infection.   Temporary fluid retention.   Allergic reaction to anesthetics or medicines used during the procedure. BEFORE THE PROCEDURE   Follow your health care provider's instructions if you are taking dietary supplements or medicines. You may need to stop taking them or reduce your dosage.   Do not take any new dietary supplements or medicines without asking your health care provider first.   Follow your health care provider's instructions about eating and drinking before the  procedure. You may need to stop eating and drinking several hours before the procedure.   Arrange to have an adult drive you home after the procedure. PROCEDURE  You may need to remove your clothing and dress in an open-back gown so that your health care provider can access your spine.   The procedure will be done while you are lying on an X-ray table. Most of the time you will be asked to lie on your stomach, but you may be asked to lie in a different position if an injection will be made in your neck.   Special machines will be used to monitor your oxygen levels, heart rate, and blood pressure.   If an injection will be made in your neck, an intravenous (IV) tube will be inserted into one of your veins. Fluids and medicine will flow directly into your body through the IV tube.   The area over the facet joint where the injection will be made will be cleaned with an antiseptic soap. The surrounding skin will be covered with sterile drapes.   An anesthetic will be applied to your skin to make the injection area numb. You may feel a temporary stinging or burning sensation.   A video X-ray machine will be used to locate the joint. A contrast dye may be injected into the facet joint area to help with locating the joint.   When the joint is located, an anesthetic medicine will be injected into the joint through the needle.   Your health care provider will ask you whether you feel pain relief. If you do feel relief, a steroid may be injected to provide pain relief for a longer period of time. If you do not feel relief or feel only partial relief, additional injections of an anesthetic may be made in other facet joints.   The needle will be removed, the skin will be cleansed, and bandages will be applied.  AFTER THE PROCEDURE   You will be observed for 15-30 minutes before being allowed to go home. Do not drive. Have an adult drive you or take a taxi or public transportation instead.    If you feel pain relief, the pain will return in several hours or days when the anesthetic wears off.   You may feel pain relief 2-14 days after the procedure. The amount of time this relief lasts varies from person to person.   It is normal to feel some tenderness over the injected area(s) for 2 days following the procedure.   If you have diabetes, you may have a temporary increase in blood sugar.   This  information is not intended to replace advice given to you by your health care provider. Make sure you discuss any questions you have with your health care provider.   Document Released: 11/29/2006 Document Revised: 07/31/2014 Document Reviewed: 04/29/2012 Elsevier Interactive Patient Education [employer]2016 Elsevier Inc.

## 2016-05-04 NOTE — Progress Notes (Signed)
Safety precautions to be maintained throughout the outpatient stay will include: orient to surroundings, keep bed in low position, maintain call bell within reach at all times, provide assistance with transfer out of bed and ambulation.  

## 2016-05-05 NOTE — Progress Notes (Signed)
Subjective:  Patient ID: Brent Cuevas, male    DOB: 1985-11-11  Age: 30 y.o. MRN: [id number]  CC: Back Pain (low)  Procedure: None   HPI Brent Cuevas presents for repeat evaluation. he has a 18 month history of low back pain following a softball tournament. He was recently seen several weeks ago and is taking medications as prescribed for his low back pain.He has been trying to decrease the frequency of his medication management however is having consistent breakthrough pain in the early evening and late evening. This pain is of the same quality characteristic and distribution in the low back and is impairing his sleep and generalized functioning. He is still trying to do his stretching strengthening exercises as best tolerated and would ultimately like to undergo interventional therapy when his insurance status is improved.tions have been well tolerated.  At this point the quality of the pain is  Stable in nature and primarily affecting his left greater than right lower back with symptoms worse with prolonged standing and back extension.  History Brent Cuevas has a past medical history of Hypertension and Lumbar herniated disc.   He has a past surgical history that includes Hernia repair.   His family history includes Depression in his maternal grandmother; Diabetes in his paternal grandfather and paternal grandmother; Hypertension in his maternal grandmother, paternal grandfather, and paternal grandmother.He reports that he has been smoking Cigarettes.  He has been smoking about 1.00 pack per day. He has never used smokeless tobacco. He reports that he does not drink alcohol or use drugs.   ---------------------------------------------------------------------------------------------------------------------- Past Medical History:  Diagnosis Date  . Hypertension   . Lumbar herniated disc     Past Surgical History:  Procedure Laterality Date  . HERNIA REPAIR     as an  infant    Family History  Problem Relation Age of Onset  . Depression Maternal Grandmother   . Hypertension Maternal Grandmother   . Hypertension Paternal Grandmother   . Diabetes Paternal Grandmother   . Hypertension Paternal Grandfather   . Diabetes Paternal Grandfather     Social History  Substance Use Topics  . Smoking status: Current Every Day Smoker    Packs/day: 1.00    Types: Cigarettes  . Smokeless tobacco: Never Used  . Alcohol use No    ---------------------------------------------------------------------------------------------------------------------- Social History   Social History  . Marital status: Married    Spouse name: N/A  . Number of children: N/A  . Years of education: N/A   Social History Main Topics  . Smoking status: Current Every Day Smoker    Packs/day: 1.00    Types: Cigarettes  . Smokeless tobacco: Never Used  . Alcohol use No  . Drug use: No  . Sexual activity: Not Asked   Other Topics Concern  . None   Social History Narrative  . None      ----------------------------------------------------------------------------------------------------------------------  ROS Review of Systems    Objective:  BP 126/74   Pulse 70   Temp 98.6 F (37 C) (Oral)   Resp 18   Ht 5\' 10"  (1.778 m)   Wt 245 lb (111.1 kg)   SpO2 97%   BMI 35.15 kg/m   Physical Exam  Patient is alert oriented cooperative compliant and a good historian Heart is regular rate and rhythm without murmur He is ambulating well with appropriate range of motion at the low backStill with considerable pain on extension with both left and right lateral rotation. But worse with left rotation  and there appears to be a trigger point in the left lower lumbar region at L5  Assessment & Plan:   Brent Cuevas was seen today for back pain.  Diagnoses and all orders for this visit:  Facet arthritis of lumbosacral region (HCC) -     LUMBAR FACET(MEDIAL BRANCH NERVE BLOCK)  MBNB; Future  Chronic midline low back pain without sciatica -     LUMBAR FACET(MEDIAL BRANCH NERVE BLOCK) MBNB; Future -     TRIGGER POINT INJECTION; Future  Low back derangement syndrome -     TRIGGER POINT INJECTION; Future  DDD (degenerative disc disease), lumbar  Other orders -     Discontinue: oxyCODONE (ROXICODONE) 5 MG immediate release tablet; Take 1 tablet (5 mg total) by mouth every 6 (six) hours as needed for severe pain. -     oxyCODONE (ROXICODONE) 5 MG immediate release tablet; Take 1 tablet (5 mg total) by mouth every 6 (six) hours as needed for severe pain.     ----------------------------------------------------------------------------------------------------------------------  Problem List Items Addressed This Visit    None    Visit Diagnoses    Facet arthritis of lumbosacral region (HCC)    -  Primary   Relevant Medications   oxyCODONE (ROXICODONE) 5 MG immediate release tablet   Other Relevant Orders   LUMBAR FACET(MEDIAL BRANCH NERVE BLOCK) MBNB   Chronic midline low back pain without sciatica       Relevant Medications   oxyCODONE (ROXICODONE) 5 MG immediate release tablet   Other Relevant Orders   LUMBAR FACET(MEDIAL BRANCH NERVE BLOCK) MBNB   TRIGGER POINT INJECTION   Low back derangement syndrome       Relevant Medications   oxyCODONE (ROXICODONE) 5 MG immediate release tablet   Other Relevant Orders   TRIGGER POINT INJECTION   DDD (degenerative disc disease), lumbar       Relevant Medications   oxyCODONE (ROXICODONE) 5 MG immediate release tablet      ----------------------------------------------------------------------------------------------------------------------  1. Chronic midline low back pain without sciatica We will Keep him on Robaxin 750 mg 1 tablet at bedtime. He may increase to 2 tablets at bedtime in 1 week if no improvement in his lower back spasming  2. Facet arthritis of lumbosacral region We will defer on diagnostic  facet injection until he has insurance secondary to cost. Presently it appears that we will be able to schedule this upon his return in 6 weeks  3. DDD (degenerative disc disease), lumbar Stretching and strengthening exercises  4. Low back derangement syndrome Stretching strengthening exercises with core strengthening and physical therapy as described today with return to clinic in 1 month Once again we have talked about his opioid medication regimen and we will continue with 2 hydrocodone in the morning 1 in the afternoon and 1 in the evening. He has been compliant with his medication management with no evidence of diversion and we'll span him out to every 2 month evaluation. Prescriptions were refilled  today. ----------------------------------------------------------------------------------------------------------------------  I am having Mr. Brunty maintain his LORazepam, amLODipine, dicloxacillin, methocarbamol, oxyCODONE-acetaminophen, and oxyCODONE.   Meds ordered this encounter  Medications  . DISCONTD: oxyCODONE (ROXICODONE) 5 MG immediate release tablet    Sig: Take 1 tablet (5 mg total) by mouth every 6 (six) hours as needed for severe pain.    Dispense:  120 tablet    Refill:  0    Do not fill until [id number]  . oxyCODONE (ROXICODONE) 5 MG immediate release tablet    Sig: Take 1  tablet (5 mg total) by mouth every 6 (six) hours as needed for severe pain.    Dispense:  120 tablet    Refill:  0    Do not fill until [id number]       Follow-up: Return in about 6 weeks (around 06/15/2016) for evaluation, procedure.    Yevette EdwardsJames G Jaquese Irving, MD

## 2016-05-14 ENCOUNTER — Encounter: Payer: Self-pay | Admitting: *Deleted

## 2016-05-14 ENCOUNTER — Emergency Department: Payer: Self-pay

## 2016-05-14 ENCOUNTER — Emergency Department
Admission: EM | Admit: 2016-05-14 | Discharge: 2016-05-14 | Disposition: A | Discharge status: Home / Self Care | Payer: Self-pay | Attending: Emergency Medicine | Admitting: Emergency Medicine

## 2016-05-14 DIAGNOSIS — R1031 Right lower quadrant pain: Secondary | ICD-10-CM

## 2016-05-14 DIAGNOSIS — N50819 Testicular pain, unspecified: Secondary | ICD-10-CM

## 2016-05-14 DIAGNOSIS — N50811 Right testicular pain: Secondary | ICD-10-CM | POA: Insufficient documentation

## 2016-05-14 DIAGNOSIS — Z79899 Other long term (current) drug therapy: Secondary | ICD-10-CM | POA: Insufficient documentation

## 2016-05-14 DIAGNOSIS — F17210 Nicotine dependence, cigarettes, uncomplicated: Secondary | ICD-10-CM | POA: Insufficient documentation

## 2016-05-14 DIAGNOSIS — I10 Essential (primary) hypertension: Secondary | ICD-10-CM | POA: Insufficient documentation

## 2016-05-14 DIAGNOSIS — N5082 Scrotal pain: Secondary | ICD-10-CM

## 2016-05-14 LAB — URINALYSIS COMPLETE WITH MICROSCOPIC (ARMC ONLY)
Bacteria, UA: NONE SEEN
Bilirubin Urine: NEGATIVE
Glucose, UA: NEGATIVE mg/dL
HGB URINE DIPSTICK: NEGATIVE
KETONES UR: NEGATIVE mg/dL
LEUKOCYTES UA: NEGATIVE
Nitrite: NEGATIVE
PH: 5 (ref 5.0–8.0)
PROTEIN: 30 mg/dL — AB
Specific Gravity, Urine: 1.029 (ref 1.005–1.030)

## 2016-05-14 LAB — COMPREHENSIVE METABOLIC PANEL
ALT: 26 U/L (ref 17–63)
ANION GAP: 8 (ref 5–15)
AST: 33 U/L (ref 15–41)
Albumin: 4.4 g/dL (ref 3.5–5.0)
Alkaline Phosphatase: 76 U/L (ref 38–126)
BUN: 15 mg/dL (ref 6–20)
CALCIUM: 9.3 mg/dL (ref 8.9–10.3)
CHLORIDE: 105 mmol/L (ref 101–111)
CO2: 24 mmol/L (ref 22–32)
Creatinine, Ser: 1.06 mg/dL (ref 0.61–1.24)
GFR calc non Af Amer: >60 mL/min (ref >60)
Glucose, Bld: 109 mg/dL — ABNORMAL HIGH (ref 65–99)
Potassium: 3.6 mmol/L (ref 3.5–5.1)
SODIUM: 137 mmol/L (ref 135–145)
Total Bilirubin: 0.4 mg/dL (ref 0.3–1.2)
Total Protein: 7.9 g/dL (ref 6.5–8.1)

## 2016-05-14 LAB — CBC
HCT: 41.8 % (ref 40.0–52.0)
HEMOGLOBIN: 14.5 g/dL (ref 13.0–18.0)
MCH: 30.4 pg (ref 26.0–34.0)
MCHC: 34.7 g/dL (ref 32.0–36.0)
MCV: 87.6 fL (ref 80.0–100.0)
Platelets: 215 10*3/uL (ref 150–440)
RBC: 4.76 MIL/uL (ref 4.40–5.90)
RDW: 13.5 % (ref 11.5–14.5)
WBC: 10.9 10*3/uL — ABNORMAL HIGH (ref 3.8–10.6)

## 2016-05-14 LAB — LIPASE, BLOOD: LIPASE: 14 U/L (ref 11–51)

## 2016-05-14 MED ORDER — ONDANSETRON HCL 4 MG/2ML IJ SOLN
4.0000 mg | Freq: Once | INTRAMUSCULAR | Status: AC
  Administered 2016-05-14: 4 mg via INTRAVENOUS
  Filled 2016-05-14: qty 2

## 2016-05-14 MED ORDER — MORPHINE SULFATE (PF) 2 MG/ML IV SOLN
4.0000 mg | Freq: Once | INTRAVENOUS | Status: AC
Start: 2016-05-14 — End: 2016-05-14
  Administered 2016-05-14: 4 mg via INTRAVENOUS
  Filled 2016-05-14: qty 2

## 2016-05-14 MED ORDER — ETODOLAC 200 MG PO CAPS: 200.0000 mg | ORAL_CAPSULE | Freq: Three times a day (TID) | ORAL | 0 refills | Status: DC

## 2016-05-14 MED ORDER — IOPAMIDOL (ISOVUE-300) INJECTION 61%
30.0000 mL | Freq: Once | INTRAVENOUS | Status: AC | PRN
  Administered 2016-05-14: 30 mL via ORAL

## 2016-05-14 MED ORDER — SODIUM CHLORIDE 0.9 % IV BOLUS (SEPSIS)
1000.0000 mL | Freq: Once | INTRAVENOUS | Status: AC
  Administered 2016-05-14: 1000 mL via INTRAVENOUS

## 2016-05-14 MED ORDER — IOPAMIDOL (ISOVUE-300) INJECTION 61%
125.0000 mL | Freq: Once | INTRAVENOUS | Status: AC | PRN
  Administered 2016-05-14: 125 mL via INTRAVENOUS

## 2016-05-14 NOTE — ED Triage Notes (Signed)
Pt arrives via EMS with c/o abdominal pain, onset tonight. IV established en route and Fentanyl administered, with mild relief. Pt states onset of right lower abdominal pain that radiates down through the right side of his testicles. Pt says that he thought he may have had constipation and used an enema  X1. Denies n/v/d. States he feels like he has to urinate frequently (for several months), but denies pain with urination.

## 2016-05-14 NOTE — ED Provider Notes (Signed)
[facility] Emergency Department Provider Note   ____________________________________________   First MD Initiated Contact with Patient 05/14/16 [phone]     (approximate)  I have reviewed the triage vital signs and the nursing notes.   HISTORY  Chief Complaint Abdominal Pain    HPI Brent Cuevas is a 30 y.o. male who comes into the hospital today with abdominal pain. He reports that he started having the abdominal pain tonight. He thought it was cramps because he needed to use the restroom. Sometimes he needs to take a suppository or fiber to have a bowel movement because he takes Percocet daily. He reports that the pain was in his right lower quadrant seems to be moving down into his groin. He rates the pain as 6-7 out of 10 in intensity. He reports that he didn't enema this evening and after he used the bathroom he still Having pain. He reports that when he feels for his testicle he feels a sac that is very painful. The patient denies any nausea or vomiting. He reports that for the past few months he's been having some pressure as well and his testicles and was given Bactrim and Flomax to help with those symptoms. The patient also has had some frequency and urgency as well. The patient reports that the pain is severe so he decided to come in tonight for evaluation.   Past Medical History:  Diagnosis Date  . Hypertension   . Lumbar herniated disc     There are no active problems to display for this patient.   Past Surgical History:  Procedure Laterality Date  . HERNIA REPAIR     as an infant    Prior to Admission medications   Medication Sig Start Date End Date Taking? Authorizing Provider  amLODipine (NORVASC) 5 MG tablet Take 5 mg by mouth daily.    Historical Provider, MD  dicloxacillin (DYNAPEN) 250 MG capsule Take 250 mg by mouth 4 (four) times daily as needed. Reported on 02/01/2016    Historical Provider, MD  etodolac (LODINE) 200 MG  capsule Take 1 capsule (200 mg total) by mouth every 8 (eight) hours. 05/14/16   Rebecka ApleyAllison P Damichael Hofman, MD  LORazepam (ATIVAN) 1 MG tablet Take 1 mg by mouth every 8 (eight) hours as needed for anxiety.    Historical Provider, MD  methocarbamol (ROBAXIN) 750 MG tablet Take 1 tablet (750 mg total) by mouth 2 (two) times daily. 10/26/15   Yevette EdwardsJames G Adams, MD  oxyCODONE (ROXICODONE) 5 MG immediate release tablet Take 1 tablet (5 mg total) by mouth every 6 (six) hours as needed for severe pain. 05/04/16   Yevette EdwardsJames G Adams, MD  oxyCODONE-acetaminophen (PERCOCET) 5-325 MG tablet Take 1 tablet by mouth every 4 (four) hours as needed for severe pain. 12/24/15   Yevette EdwardsJames G Adams, MD    Allergies Review of patient's allergies indicates no known allergies.  Family History  Problem Relation Age of Onset  . Depression Maternal Grandmother   . Hypertension Maternal Grandmother   . Hypertension Paternal Grandmother   . Diabetes Paternal Grandmother   . Hypertension Paternal Grandfather   . Diabetes Paternal Grandfather     Social History Social History  Substance Use Topics  . Smoking status: Current Every Day Smoker    Packs/day: 1.00    Types: Cigarettes  . Smokeless tobacco: Never Used  . Alcohol use No    Review of Systems Constitutional: No fever/chills Eyes: No visual changes. ENT: No sore throat.  Cardiovascular: Denies chest pain. Respiratory: Denies shortness of breath. Gastrointestinal:  abdominal pain And constipation. Genitourinary: Negative for dysuria. Musculoskeletal: Negative for back pain. Skin: Negative for rash. Neurological: Negative for headaches, focal weakness or numbness.  10-point ROS otherwise negative.  ____________________________________________   PHYSICAL EXAM:  VITAL SIGNS: ED Triage Vitals  Enc Vitals Group     BP 05/14/16 0038 120/70     Pulse Rate 05/14/16 0038 70     Resp 05/14/16 0038 16     Temp 05/14/16 0038 98.2 F (36.8 C)     Temp Source 05/14/16 0038  Oral     SpO2 05/14/16 0038 96 %     Weight 05/14/16 0035 245 lb (111.1 kg)     Height 05/14/16 0035 5\' 10"  (1.778 m)     Head Circumference --      Peak Flow --      Pain Score 05/14/16 0036 10     Pain Loc --      Pain Edu? --      Excl. in GC? --     Constitutional: Alert and oriented. Well appearing and in Moderate distress. Eyes: Conjunctivae are normal. PERRL. EOMI. Head: Atraumatic. Nose: No congestion/rhinnorhea. Mouth/Throat: Mucous membranes are moist.  Oropharynx non-erythematous. Cardiovascular: Normal rate, regular rhythm. Grossly normal heart sounds.  Good peripheral circulation. Respiratory: Normal respiratory effort.  No retractions. Lungs CTAB. Gastrointestinal: Soft with some right upper and lower quadrant tenderness to palpation No distention. Positive bowel sounds Genitourinary: Patient with one testicle likely originating from the left with some soft tissue swelling and tenderness to the right scrotal area Musculoskeletal: No lower extremity tenderness nor edema.  Neurologic:  Normal speech and language.  Skin:  Skin is warm, dry and intact.  Psychiatric: Mood and affect are normal.  ____________________________________________   LABS (all labs ordered are listed, but only abnormal results are displayed)  Labs Reviewed  COMPREHENSIVE METABOLIC PANEL - Abnormal; Notable for the following:       Result Value   Glucose, Bld 109 (*)    All other components within normal limits  CBC - Abnormal; Notable for the following:    WBC 10.9 (*)    All other components within normal limits  URINALYSIS COMPLETEWITH MICROSCOPIC ([facility] ONLY) - Abnormal; Notable for the following:    Color, Urine YELLOW (*)    APPearance CLEAR (*)    Protein, ur 30 (*)    Squamous Epithelial / LPF 0-5 (*)    All other components within normal limits  LIPASE, BLOOD    ____________________________________________  EKG  none ____________________________________________  RADIOLOGY  CT abdomen and pelvis ____________________________________________   PROCEDURES  Procedure(s) performed: None  Procedures  Critical Care performed: No  ____________________________________________   INITIAL IMPRESSION / ASSESSMENT AND PLAN / ED COURSE  Pertinent labs & imaging results that were available during my care of the patient were reviewed by me and considered in my medical decision making (see chart for details).  This is a 30 year old male who comes into the hospital today with some abdominal pain. The patient is also having pain to his testicles. I will check some blood work and some patient for a CT. He has right-sided pain so I will evaluate for appendicitis and cholecystitis. If the patient's CT is unremarkable I will further evaluate the patient's testicle. The patient receive a dose of morphine for his pain as well as some Zofran. I will also give the patient liter of normal saline.  Clinical Course  Value Comment By Time  CT Abdomen Pelvis W Contrast No acute intra-abdominal or pelvic process.  Normal appendix. Rebecka Apley, MD 10/22 [phone]  US Scrotum Prior right orchectomy. Unremarkable left testicular arches sound with Doppler detected blood flow within the left testicle.   Rebecka Apley, MD 10/22 [phone]   The patient's CT as well as the ultrasound is unremarkable. I am unsure what is causing the patient's pain at this time. I will have the patient follow up with his primary care physician for further evaluation. His blood work is also unremarkable.  ____________________________________________   FINAL CLINICAL IMPRESSION(S) / ED DIAGNOSES  Final diagnoses:  Testicle pain  Right lower quadrant abdominal pain  Scrotal pain      NEW MEDICATIONS STARTED DURING THIS VISIT:  New Prescriptions   ETODOLAC (LODINE) 200 MG  CAPSULE    Take 1 capsule (200 mg total) by mouth every 8 (eight) hours.     Note:  This document was prepared using Dragon voice recognition software and may include unintentional dictation errors.    Rebecka Apley, MD 05/14/16 [phone]

## 2016-05-14 NOTE — Discharge Instructions (Signed)
Please f/u with primary care physician

## 2016-05-14 NOTE — ED Notes (Signed)
Patient transported to CT 

## 2016-05-14 NOTE — ED Notes (Signed)
MD at bedside. 

## 2016-06-20 ENCOUNTER — Telehealth: Payer: Self-pay | Admitting: *Deleted

## 2016-06-20 NOTE — Telephone Encounter (Signed)
Will speak to Dr. Pernell DupreAdams about writing 1 months supply of opioid so that his insurance will be in place by January and he can have his procedure/med refill with coverage and not have to pay out of pocket.

## 2016-06-22 NOTE — Telephone Encounter (Signed)
Dr. Pernell DupreAdams approved 1 month refill of Percocet. Patient to get new insurance on 07-24-2016. He will call angie with new info so that she can get procedure approved. Patient to make follow up appointment for med/refill/proc today so that he can get his medications refilled by 08-04-2016. [facility] sooner than [facility]-04-2016.

## 2016-08-01 ENCOUNTER — Ambulatory Visit: Payer: BLUE CROSS/BLUE SHIELD | Attending: Anesthesiology | Admitting: Anesthesiology

## 2016-08-01 ENCOUNTER — Encounter: Payer: Self-pay | Admitting: Anesthesiology

## 2016-08-01 VITALS — BP 134/88 | HR 85 | Temp 98.0°F | Resp 16 | Ht 70.0 in | Wt 255.0 lb

## 2016-08-01 DIAGNOSIS — F17210 Nicotine dependence, cigarettes, uncomplicated: Secondary | ICD-10-CM | POA: Insufficient documentation

## 2016-08-01 DIAGNOSIS — Z79899 Other long term (current) drug therapy: Secondary | ICD-10-CM | POA: Diagnosis not present

## 2016-08-01 DIAGNOSIS — M5442 Lumbago with sciatica, left side: Secondary | ICD-10-CM | POA: Insufficient documentation

## 2016-08-01 DIAGNOSIS — M545 Low back pain: Secondary | ICD-10-CM

## 2016-08-01 DIAGNOSIS — M5440 Lumbago with sciatica, unspecified side: Secondary | ICD-10-CM

## 2016-08-01 DIAGNOSIS — M5387 Other specified dorsopathies, lumbosacral region: Secondary | ICD-10-CM | POA: Insufficient documentation

## 2016-08-01 DIAGNOSIS — I10 Essential (primary) hypertension: Secondary | ICD-10-CM | POA: Diagnosis not present

## 2016-08-01 DIAGNOSIS — M25562 Pain in left knee: Secondary | ICD-10-CM | POA: Insufficient documentation

## 2016-08-01 DIAGNOSIS — M5136 Other intervertebral disc degeneration, lumbar region: Secondary | ICD-10-CM | POA: Insufficient documentation

## 2016-08-01 DIAGNOSIS — M5441 Lumbago with sciatica, right side: Secondary | ICD-10-CM | POA: Diagnosis not present

## 2016-08-01 DIAGNOSIS — M5386 Other specified dorsopathies, lumbar region: Secondary | ICD-10-CM

## 2016-08-01 DIAGNOSIS — M4687 Other specified inflammatory spondylopathies, lumbosacral region: Secondary | ICD-10-CM | POA: Insufficient documentation

## 2016-08-01 DIAGNOSIS — M47817 Spondylosis without myelopathy or radiculopathy, lumbosacral region: Secondary | ICD-10-CM

## 2016-08-01 DIAGNOSIS — M4697 Unspecified inflammatory spondylopathy, lumbosacral region: Secondary | ICD-10-CM | POA: Diagnosis not present

## 2016-08-01 DIAGNOSIS — G8929 Other chronic pain: Secondary | ICD-10-CM

## 2016-08-01 MED ORDER — OXYCODONE HCL 5 MG PO TABS: 5.0000 mg | ORAL_TABLET | Freq: Three times a day (TID) | ORAL | 0 refills | Status: DC

## 2016-08-01 MED ORDER — OXYCODONE HCL 5 MG PO TABS: 5.0000 mg | ORAL_TABLET | Freq: Four times a day (QID) | ORAL | 0 refills | Status: DC | PRN

## 2016-08-01 MED ORDER — CYCLOBENZAPRINE HCL 10 MG PO TABS: 10.0000 mg | ORAL_TABLET | Freq: Two times a day (BID) | ORAL | 2 refills | Status: AC

## 2016-08-01 NOTE — Patient Instructions (Signed)
Facet Joint Block The facet joints connect the bones of the spine (vertebrae). They make it possible for you to bend, twist, and make other movements with your spine. They also prevent you from overbending, overtwisting, and making other excessive movements.  A facet joint block is a procedure where a numbing medicine (anesthetic) is injected into a facet joint. Often, a type of anti-inflammatory medicine called a steroid is also injected. A facet joint block may be done for two reasons:   Diagnosis. A facet joint block may be done as a test to see whether neck or back pain is caused by a worn-down or infected facet joint. If the pain gets better after a facet joint block, it means the pain is probably coming from the facet joint. If the pain does not get better, it means the pain is probably not coming from the facet joint.   Therapy. A facet joint block may be done to relieve neck or back pain caused by a facet joint. A facet joint block is only done as a therapy if the pain does not improve with medicine, exercise programs, physical therapy, and other forms of pain management. LET YOUR HEALTH CARE PROVIDER KNOW ABOUT:   Any allergies you have.   All medicines you are taking, including vitamins, herbs, eyedrops, and over-the-counter medicines and creams.   Previous problems you or members of your family have had with the use of anesthetics.   Any blood disorders you have had.   Other health problems you have. RISKS AND COMPLICATIONS Generally, having a facet joint block is safe. However, as with any procedure, complications can occur. Possible complications associated with having a facet joint block include:   Bleeding.   Injury to a nerve near the injection site.   Pain at the injection site.   Weakness or numbness in areas controlled by nerves near the injection site.   Infection.   Temporary fluid retention.   Allergic reaction to anesthetics or medicines used during  the procedure. BEFORE THE PROCEDURE   Follow your health care provider's instructions if you are taking dietary supplements or medicines. You may need to stop taking them or reduce your dosage.   Do not take any new dietary supplements or medicines without asking your health care provider first.   Follow your health care provider's instructions about eating and drinking before the procedure. You may need to stop eating and drinking several hours before the procedure.   Arrange to have an adult drive you home after the procedure. PROCEDURE  You may need to remove your clothing and dress in an open-back gown so that your health care provider can access your spine.   The procedure will be done while you are lying on an X-ray table. Most of the time you will be asked to lie on your stomach, but you may be asked to lie in a different position if an injection will be made in your neck.   Special machines will be used to monitor your oxygen levels, heart rate, and blood pressure.   If an injection will be made in your neck, an intravenous (IV) tube will be inserted into one of your veins. Fluids and medicine will flow directly into your body through the IV tube.   The area over the facet joint where the injection will be made will be cleaned with an antiseptic soap. The surrounding skin will be covered with sterile drapes.   An anesthetic will be applied to your skin   to make the injection area numb. You may feel a temporary stinging or burning sensation.   A video X-ray machine will be used to locate the joint. A contrast dye may be injected into the facet joint area to help with locating the joint.   When the joint is located, an anesthetic medicine will be injected into the joint through the needle.   Your health care provider will ask you whether you feel pain relief. If you do feel relief, a steroid may be injected to provide pain relief for a longer period of time. If you do not  feel relief or feel only partial relief, additional injections of an anesthetic may be made in other facet joints.   The needle will be removed, the skin will be cleansed, and bandages will be applied.  AFTER THE PROCEDURE   You will be observed for 15-30 minutes before being allowed to go home. Do not drive. Have an adult drive you or take a taxi or public transportation instead.   If you feel pain relief, the pain will return in several hours or days when the anesthetic wears off.   You may feel pain relief 2-14 days after the procedure. The amount of time this relief lasts varies from person to person.   It is normal to feel some tenderness over the injected area(s) for 2 days following the procedure.   If you have diabetes, you may have a temporary increase in blood sugar. This information is not intended to replace advice given to you by your health care provider. Make sure you discuss any questions you have with your health care provider. Document Released: 11/29/2006 Document Revised: 07/31/2014 Document Reviewed: 04/05/2015 [employer] Interactive Patient Education  2017 [employer] Pain Management Discharge Instructions  General Discharge Instructions :  If you need to reach your doctor call: Monday-Friday 8:00 am - 4:00 pm at [phone] or toll free [phone].  After clinic hours [phone] to have operator reach doctor.  Bring all of your medication bottles to all your appointments in the pain clinic.  To cancel or reschedule your appointment with Pain Management please remember to call 24 hours in advance to avoid a fee.  Refer to the educational materials which you have been given on: General Risks, I had my Procedure. Discharge Instructions, Post Sedation.  Post Procedure Instructions:  The drugs you were given will stay in your system until tomorrow, so for the next 24 hours you should not drive, make any legal decisions or drink any alcoholic  beverages.  You may eat anything you prefer, but it is better to start with liquids then soups and crackers, and gradually work up to solid foods.  Please notify your doctor immediately if you have any unusual bleeding, trouble breathing or pain that is not related to your normal pain.  Depending on the type of procedure that was done, some parts of your body may feel week and/or numb.  This usually clears up by tonight or the next day.  Walk with the use of an assistive device or accompanied by an adult for the 24 hours.  You may use ice on the affected area for the first 24 hours.  Put ice in a Ziploc bag and cover with a towel and place against area 15 minutes on 15 minutes off.  You may switch to heat after 24 hours.GENERAL RISKS AND COMPLICATIONS  What are the risk, side effects and possible complications? Generally speaking, most procedures are safe.  However,  with any procedure there are risks, side effects, and the possibility of complications.  The risks and complications are dependent upon the sites that are lesioned, or the type of nerve block to be performed.  The closer the procedure is to the spine, the more serious the risks are.  Great care is taken when placing the radio frequency needles, block needles or lesioning probes, but sometimes complications can occur. 1. Infection: Any time there is an injection through the skin, there is a risk of infection.  This is why sterile conditions are used for these blocks.  There are four possible types of infection. 1. Localized skin infection. 2. Central Nervous System Infection-This can be in the form of Meningitis, which can be deadly. 3. Epidural Infections-This can be in the form of an epidural abscess, which can cause pressure inside of the spine, causing compression of the spinal cord with subsequent paralysis. This would require an emergency surgery to decompress, and there are no guarantees that the patient would recover from the  paralysis. 4. Discitis-This is an infection of the intervertebral discs.  It occurs in about 1% of discography procedures.  It is difficult to treat and it may lead to surgery.        2. Pain: the needles have to go through skin and soft tissues, will cause soreness.       3. Damage to internal structures:  The nerves to be lesioned may be near blood vessels or    other nerves which can be potentially damaged.       4. Bleeding: Bleeding is more common if the patient is taking blood thinners such as  aspirin, Coumadin, Ticiid, Plavix, etc., or if he/she have some genetic predisposition  such as hemophilia. Bleeding into the spinal canal can cause compression of the spinal  cord with subsequent paralysis.  This would require an emergency surgery to  decompress and there are no guarantees that the patient would recover from the  paralysis.       5. Pneumothorax:  Puncturing of a lung is a possibility, every time a needle is introduced in  the area of the chest or upper back.  Pneumothorax refers to free air around the  collapsed lung(s), inside of the thoracic cavity (chest cavity).  Another two possible  complications related to a similar event would include: Hemothorax and Chylothorax.   These are variations of the Pneumothorax, where instead of air around the collapsed  lung(s), you may have blood or chyle, respectively.       6. Spinal headaches: They may occur with any procedures in the area of the spine.       7. Persistent CSF (Cerebro-Spinal Fluid) leakage: This is a rare problem, but may occur  with prolonged intrathecal or epidural catheters either due to the formation of a fistulous  track or a dural tear.       8. Nerve damage: By working so close to the spinal cord, there is always a possibility of  nerve damage, which could be as serious as a permanent spinal cord injury with  paralysis.       9. Death:  Although rare, severe deadly allergic reactions known as "Anaphylactic  reaction" can  occur to any of the medications used.      10. Worsening of the symptoms:  We can always make thing worse.  What are the chances of something like this happening? Chances of any of this occuring are extremely low.  By  statistics, you have more of a chance of getting killed in a motor vehicle accident: while driving to the hospital than any of the above occurring .  Nevertheless, you should be aware that they are possibilities.  In general, it is similar to taking a shower.  Everybody knows that you can slip, hit your head and get killed.  Does that mean that you should not shower again?  Nevertheless always keep in mind that statistics do not mean anything if you happen to be on the wrong side of them.  Even if a procedure has a 1 (one) in a 1,000,000 (million) chance of going wrong, it you happen to be that one..Also, keep in mind that by statistics, you have more of a chance of having something go wrong when taking medications.  Who should not have this procedure? If you are on a blood thinning medication (e.g. Coumadin, Plavix, see list of "Blood Thinners"), or if you have an active infection going on, you should not have the procedure.  If you are taking any blood thinners, please inform your physician.  How should I prepare for this procedure?  Do not eat or drink anything at least six hours prior to the procedure.  Bring a driver with you .  It cannot be a taxi.  Come accompanied by an adult that can drive you back, and that is strong enough to help you if your legs get weak or numb from the local anesthetic.  Take all of your medicines the morning of the procedure with just enough water to swallow them.  If you have diabetes, make sure that you are scheduled to have your procedure done first thing in the morning, whenever possible.  If you have diabetes, take only half of your insulin dose and notify our nurse that you have done so as soon as you arrive at the clinic.  If you are  diabetic, but only take blood sugar pills (oral hypoglycemic), then do not take them on the morning of your procedure.  You may take them after you have had the procedure.  Do not take aspirin or any aspirin-containing medications, at least eleven (11) days prior to the procedure.  They may prolong bleeding.  Wear loose fitting clothing that may be easy to take off and that you would not mind if it got stained with Betadine or blood.  Do not wear any jewelry or perfume  Remove any nail coloring.  It will interfere with some of our monitoring equipment.  NOTE: Remember that this is not meant to be interpreted as a complete list of all possible complications.  Unforeseen problems may occur.  BLOOD THINNERS The following drugs contain aspirin or other products, which can cause increased bleeding during surgery and should not be taken for 2 weeks prior to and 1 week after surgery.  If you should need take something for relief of minor pain, you may take acetaminophen which is found in Tylenol,m Datril, Anacin-3 and Panadol. It is not blood thinner. The products listed below are.  Do not take any of the products listed below in addition to any listed on your instruction sheet.  A.P.C or A.P.C with Codeine Codeine Phosphate Capsules #3 Ibuprofen Ridaura  ABC compound Congesprin Imuran rimadil  Advil Cope Indocin Robaxisal  Alka-Seltzer Effervescent Pain Reliever and Antacid Coricidin or Coricidin-D  Indomethacin Rufen  Alka-Seltzer plus Cold Medicine Cosprin Ketoprofen S-A-C Tablets  Anacin Analgesic Tablets or Capsules Coumadin Korlgesic Salflex  Anacin Extra Strength  Analgesic tablets or capsules CP-2 Tablets Lanoril Salicylate  Anaprox Cuprimine Capsules Levenox Salocol  Anexsia-D Dalteparin Magan Salsalate  Anodynos Darvon compound Magnesium Salicylate Sine-off  Ansaid Dasin Capsules Magsal Sodium Salicylate  Anturane Depen Capsules Marnal Soma  APF Arthritis pain formula Dewitt's Pills  Measurin Stanback  Argesic Dia-Gesic Meclofenamic Sulfinpyrazone  Arthritis Bayer Timed Release Aspirin Diclofenac Meclomen Sulindac  Arthritis pain formula Anacin Dicumarol Medipren Supac  Analgesic (Safety coated) Arthralgen Diffunasal Mefanamic Suprofen  Arthritis Strength Bufferin Dihydrocodeine Mepro Compound Suprol  Arthropan liquid Dopirydamole Methcarbomol with Aspirin Synalgos  ASA tablets/Enseals Disalcid Micrainin Tagament  Ascriptin Doan's Midol Talwin  Ascriptin A/D Dolene Mobidin Tanderil  Ascriptin Extra Strength Dolobid Moblgesic Ticlid  Ascriptin with Codeine Doloprin or Doloprin with Codeine Momentum Tolectin  Asperbuf Duoprin Mono-gesic Trendar  Aspergum Duradyne Motrin or Motrin IB Triminicin  Aspirin plain, buffered or enteric coated Durasal Myochrisine Trigesic  Aspirin Suppositories Easprin Nalfon Trillsate  Aspirin with Codeine Ecotrin Regular or Extra Strength Naprosyn Uracel  Atromid-S Efficin Naproxen Ursinus  Auranofin Capsules Elmiron Neocylate Vanquish  Axotal Emagrin Norgesic Verin  Azathioprine Empirin or Empirin with Codeine Normiflo Vitamin E  Azolid Emprazil Nuprin Voltaren  Bayer Aspirin plain, buffered or children's or timed BC Tablets or powders Encaprin Orgaran Warfarin Sodium  Buff-a-Comp Enoxaparin Orudis Zorpin  Buff-a-Comp with Codeine Equegesic Os-Cal-Gesic   Buffaprin Excedrin plain, buffered or Extra Strength Oxalid   Bufferin Arthritis Strength Feldene Oxphenbutazone   Bufferin plain or Extra Strength Feldene Capsules Oxycodone with Aspirin   Bufferin with Codeine Fenoprofen Fenoprofen Pabalate or Pabalate-SF   Buffets II Flogesic Panagesic   Buffinol plain or Extra Strength Florinal or Florinal with Codeine Panwarfarin   Buf-Tabs Flurbiprofen Penicillamine   Butalbital Compound Four-way cold tablets Penicillin   Butazolidin Fragmin Pepto-Bismol   Carbenicillin Geminisyn Percodan   Carna Arthritis Reliever Geopen Persantine    Carprofen Gold's salt Persistin   Chloramphenicol Goody's Phenylbutazone   Chloromycetin Haltrain Piroxlcam   Clmetidine heparin Plaquenil   Cllnoril Hyco-pap Ponstel   Clofibrate Hydroxy chloroquine Propoxyphen         Before stopping any of these medications, be sure to consult the physician who ordered them.  Some, such as Coumadin (Warfarin) are ordered to prevent or treat serious conditions such as "deep thrombosis", "pumonary embolisms", and other heart problems.  The amount of time that you may need off of the medication may also vary with the medication and the reason for which you were taking it.  If you are taking any of these medications, please make sure you notify your pain physician before you undergo any procedures.
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
## 2016-08-01 NOTE — Progress Notes (Signed)
Safety precautions to be maintained throughout the outpatient stay will include: orient to surroundings, keep bed in low position, maintain call bell within reach at all times, provide assistance with transfer out of bed and ambulation.  

## 2016-08-08 LAB — TOXASSURE SELECT 13 (MW), URINE

## 2016-09-07 ENCOUNTER — Ambulatory Visit (HOSPITAL_BASED_OUTPATIENT_CLINIC_OR_DEPARTMENT_OTHER): Payer: BLUE CROSS/BLUE SHIELD | Admitting: Anesthesiology

## 2016-09-07 ENCOUNTER — Encounter: Payer: Self-pay | Admitting: Anesthesiology

## 2016-09-07 ENCOUNTER — Other Ambulatory Visit: Payer: Self-pay | Admitting: Anesthesiology

## 2016-09-07 ENCOUNTER — Ambulatory Visit
Admission: RE | Admit: 2016-09-07 | Discharge: 2016-09-07 | Disposition: A | Discharge status: Home / Self Care | Payer: BLUE CROSS/BLUE SHIELD | Source: Ambulatory Visit | Attending: Anesthesiology | Admitting: Anesthesiology

## 2016-09-07 DIAGNOSIS — M47817 Spondylosis without myelopathy or radiculopathy, lumbosacral region: Secondary | ICD-10-CM

## 2016-09-07 DIAGNOSIS — R52 Pain, unspecified: Secondary | ICD-10-CM

## 2016-09-07 DIAGNOSIS — M4697 Unspecified inflammatory spondylopathy, lumbosacral region: Secondary | ICD-10-CM | POA: Diagnosis not present

## 2016-09-07 DIAGNOSIS — G8929 Other chronic pain: Secondary | ICD-10-CM | POA: Diagnosis not present

## 2016-09-07 DIAGNOSIS — M5442 Lumbago with sciatica, left side: Secondary | ICD-10-CM | POA: Diagnosis not present

## 2016-09-07 DIAGNOSIS — M545 Low back pain: Secondary | ICD-10-CM

## 2016-09-07 MED ORDER — MIDAZOLAM HCL 5 MG/5ML IJ SOLN
INTRAMUSCULAR | Status: AC
  Filled 2016-09-07: qty 5

## 2016-09-07 MED ORDER — TRIAMCINOLONE ACETONIDE 40 MG/ML IJ SUSP
INTRAMUSCULAR | Status: AC
  Filled 2016-09-07: qty 1

## 2016-09-07 MED ORDER — TRIAMCINOLONE ACETONIDE 40 MG/ML IJ SUSP
40.0000 mg | Freq: Once | INTRAMUSCULAR | Status: DC
  Filled 2016-09-07: qty 1

## 2016-09-07 MED ORDER — ROPIVACAINE HCL 2 MG/ML IJ SOLN
10.0000 mL | Freq: Once | INTRAMUSCULAR | Status: DC
Start: 2016-09-07 — End: 2020-08-05
  Filled 2016-09-07: qty 20

## 2016-09-07 MED ORDER — OXYCODONE HCL 5 MG PO TABS: 5.0000 mg | ORAL_TABLET | Freq: Three times a day (TID) | ORAL | 0 refills | Status: DC

## 2016-09-07 MED ORDER — SODIUM CHLORIDE 0.9% FLUSH: 10.0000 mL | Freq: Once | INTRAVENOUS | Status: DC

## 2016-09-07 MED ORDER — LACTATED RINGERS IV SOLN: 1000.0000 mL | INTRAVENOUS | Status: DC

## 2016-09-07 MED ORDER — ORPHENADRINE CITRATE 30 MG/ML IJ SOLN
INTRAMUSCULAR | Status: AC
  Filled 2016-09-07: qty 2

## 2016-09-07 MED ORDER — LIDOCAINE HCL (PF) 1 % IJ SOLN
5.0000 mL | Freq: Once | INTRAMUSCULAR | Status: DC
Start: 2016-09-07 — End: 2020-08-05
  Filled 2016-09-07: qty 5

## 2016-09-07 MED ORDER — FENTANYL CITRATE (PF) 100 MCG/2ML IJ SOLN
100.0000 ug | Freq: Once | INTRAMUSCULAR | Status: DC
Start: 2016-09-07 — End: 2020-08-05
  Filled 2016-09-07: qty 2

## 2016-09-07 NOTE — Patient Instructions (Signed)
Pain Management Discharge Instructions  General Discharge Instructions :  If you need to reach your doctor call: Monday-Friday 8:00 am - 4:00 pm at [phone] or toll free [phone].  After clinic hours [phone] to have operator reach doctor.  Bring all of your medication bottles to all your appointments in the pain clinic.  To cancel or reschedule your appointment with Pain Management please remember to call 24 hours in advance to avoid a fee.  Refer to the educational materials which you have been given on: General Risks, I had my Procedure. Discharge Instructions, Post Sedation.  Post Procedure Instructions:  The drugs you were given will stay in your system until tomorrow, so for the next 24 hours you should not drive, make any legal decisions or drink any alcoholic beverages.  You may eat anything you prefer, but it is better to start with liquids then soups and crackers, and gradually work up to solid foods.  Please notify your doctor immediately if you have any unusual bleeding, trouble breathing or pain that is not related to your normal pain.  Depending on the type of procedure that was done, some parts of your body may feel week and/or numb.  This usually clears up by tonight or the next day.  Walk with the use of an assistive device or accompanied by an adult for the 24 hours.  You may use ice on the affected area for the first 24 hours.  Put ice in a Ziploc bag and cover with a towel and place against area 15 minutes on 15 minutes off.  You may switch to heat after 24 hours.Facet Blocks Patient Information  Description: The facets are joints in the spine between the vertebrae.  Like any joints in the body, facets can become irritated and painful.  Arthritis can also effect the facets.  By injecting steroids and local anesthetic in and around these joints, we can temporarily block the nerve supply to them.  Steroids act directly on irritated nerves and tissues to  reduce selling and inflammation which often leads to decreased pain.  Facet blocks may be done anywhere along the spine from the neck to the low back depending upon the location of your pain.   After numbing the skin with local anesthetic (like Novocaine), a small needle is passed onto the facet joints under x-ray guidance.  You may experience a sensation of pressure while this is being done.  The entire block usually lasts about 15-25 minutes.   Conditions which may be treated by facet blocks:   Low back/buttock pain  Neck/shoulder pain  Certain types of headaches  Preparation for the injection:  1. Do not eat any solid food or dairy products within 8 hours of your appointment. 2. You may drink clear liquid up to 3 hours before appointment.  Clear liquids include water, black coffee, juice or soda.  No milk or cream please. 3. You may take your regular medication, including pain medications, with a sip of water before your appointment.  Diabetics should hold regular insulin (if taken separately) and take 1/2 normal NPH dose the morning of the procedure.  Carry some sugar containing items with you to your appointment. 4. A driver must accompany you and be prepared to drive you home after your procedure. 5. Bring all your current medications with you. 6. An IV may be inserted and sedation may be given at the discretion of the physician. 7. A blood pressure cuff, EKG and other monitors will often be   applied during the procedure.  Some patients may need to have extra oxygen administered for a short period. 8. You will be asked to provide medical information, including your allergies and medications, prior to the procedure.  We must know immediately if you are taking blood thinners (like Coumadin/Warfarin) or if you are allergic to IV iodine contrast (dye).  We must know if you could possible be pregnant.  Possible side-effects:   Bleeding from needle site  Infection (rare, may require  surgery)  Nerve injury (rare)  Numbness & tingling (temporary)  Difficulty urinating (rare, temporary)  Spinal headache (a headache worse with upright posture)  Light-headedness (temporary)  Pain at injection site (serveral days)  Decreased blood pressure (rare, temporary)  Weakness in arm/leg (temporary)  Pressure sensation in back/neck (temporary)   Call if you experience:   Fever/chills associated with headache or increased back/neck pain  Headache worsened by an upright position  New onset, weakness or numbness of an extremity below the injection site  Hives or difficulty breathing (go to the emergency room)  Inflammation or drainage at the injection site(s)  Severe back/neck pain greater than usual  New symptoms which are concerning to you  Please note:  Although the local anesthetic injected can often make your back or neck feel good for several hours after the injection, the pain will likely return. It takes 3-7 days for steroids to work.  You may not notice any pain relief for at least one week.  If effective, we will often do a series of 2-3 injections spaced 3-6 weeks apart to maximally decrease your pain.  After the initial series, you may be a candidate for a more permanent nerve block of the facets.  If you have any questions, please call [phone] [facility] 

## 2016-09-07 NOTE — Progress Notes (Signed)
Safety precautions to be maintained throughout the outpatient stay will include: orient to surroundings, keep bed in low position, maintain call bell within reach at all times, provide assistance with transfer out of bed and ambulation.  

## 2016-09-07 NOTE — Progress Notes (Addendum)
Subjective:  Patient ID: Brent Cuevas, male    DOB: [birth year]  Age: 31 y.o. MRN: [id number]  CC: Back Pain (low and bilateral)  Procedure: Bilateral L3-4 L4-5 L5-S1 and S1 medial branch block to the facets under fluoroscopic guidance with moderate sedation #1   HPI Brent FlashChristopher S Cuevas presents today for reevaluation. He continues to have severe pain in the low back as previously described and desires to proceed with a diagnostic facet block today. The quality characteristic and discharged in his pain has been stable without change in lower extremity strength or function. He continues to take his medications as prescribed with no mentioning of diverting or illicit used upon questioning and maintains that he is getting good relief with the medications and they are improving his overall lifestyle and function and enabling him to continue to work. Otherwise he reports being in his usual state of health.  History Brent Cuevas has a past medical history of Hypertension and Lumbar herniated disc.   He has a past surgical history that includes Hernia repair.   His family history includes Depression in his maternal grandmother; Diabetes in his paternal grandfather and paternal grandmother; Hypertension in his maternal grandmother, paternal grandfather, and paternal grandmother.He reports that he has been smoking Cigarettes.  He has been smoking about 1.00 pack per day. He has never used smokeless tobacco. He reports that he does not drink alcohol or use drugs.   ---------------------------------------------------------------------------------------------------------------------- Past Medical History:  Diagnosis Date  . Hypertension   . Lumbar herniated disc     Past Surgical History:  Procedure Laterality Date  . HERNIA REPAIR     as an infant    Family History  Problem Relation Age of Onset  . Depression Maternal Grandmother   . Hypertension Maternal Grandmother   .  Hypertension Paternal Grandmother   . Diabetes Paternal Grandmother   . Hypertension Paternal Grandfather   . Diabetes Paternal Grandfather     Social History  Substance Use Topics  . Smoking status: Current Every Day Smoker    Packs/day: 1.00    Types: Cigarettes  . Smokeless tobacco: Never Used  . Alcohol use No    ---------------------------------------------------------------------------------------------------------------------- Social History   Social History  . Marital status: Married    Spouse name: N/A  . Number of children: N/A  . Years of education: N/A   Social History Main Topics  . Smoking status: Current Every Day Smoker    Packs/day: 1.00    Types: Cigarettes  . Smokeless tobacco: Never Used  . Alcohol use No  . Drug use: No  . Sexual activity: Not Asked   Other Topics Concern  . None   Social History Narrative  . None      ----------------------------------------------------------------------------------------------------------------------  ROS Review of Systems  Cardiac: No angina Pulmonary: No shortness of breath GI: No constipation   Objective:  BP (!) 146/89   Pulse 62   Temp 98.4 F (36.9 C)   Resp 18   Ht 5\' 10"  (1.778 m)   Wt 255 lb (115.7 kg)   SpO2 97%   BMI 36.59 kg/m   Physical Exam  Patient is alert oriented cooperative compliant and a good historian Heart is regular rate and rhythm without murmur He is ambulating well with appropriate range of motion at the low back. He continues to have pain with extension at the low back and bilateral rotation.  Assessment & Plan:   Brent Cuevas was seen today for back pain.  Diagnoses and all  orders for this visit:  Facet arthritis of lumbosacral region (HCC) -     LUMBAR FACET(MEDIAL BRANCH NERVE BLOCK) MBNB -     triamcinolone acetonide (KENALOG-40) injection 40 mg; 1 mL (40 mg total) by Other route once. -     sodium chloride flush (NS) 0.9 % injection 10 mL; 10 mLs by  Other route once. -     ropivacaine (PF) 2 mg/mL (0.2%) (NAROPIN) injection 10 mL; 10 mLs by Epidural route once. -     lidocaine (PF) (XYLOCAINE) 1 % injection 5 mL; Inject 5 mLs into the skin once. -     lactated ringers infusion 1,000 mL; Inject 1,000 mLs into the vein continuous. -     fentaNYL (SUBLIMAZE) injection 100 mcg; Inject 2 mLs (100 mcg total) into the vein once. -     LUMBAR FACET(MEDIAL BRANCH NERVE BLOCK) MBNB -     LUMBAR FACET(MEDIAL BRANCH NERVE BLOCK) MBNB; Future -     ToxASSURE Select 13 (MW), Urine  Chronic midline low back pain with left-sided sciatica -     LUMBAR FACET(MEDIAL BRANCH NERVE BLOCK) MBNB -     LUMBAR FACET(MEDIAL BRANCH NERVE BLOCK) MBNB; Future -     ToxASSURE Select 13 (MW), Urine  Chronic midline low back pain without sciatica -     LUMBAR FACET(MEDIAL BRANCH NERVE BLOCK) MBNB  Other orders -     triamcinolone acetonide (KENALOG-40) 40 MG/ML injection;  -     midazolam (VERSED) 5 MG/5ML injection;  -     orphenadrine (NORFLEX) 30 MG/ML injection;  -     Discontinue: oxyCODONE (ROXICODONE) 5 MG immediate release tablet; Take 1 tablet (5 mg total) by mouth 3 (three) times daily. -     oxyCODONE (ROXICODONE) 5 MG immediate release tablet; Take 1 tablet (5 mg total) by mouth 3 (three) times daily.     ----------------------------------------------------------------------------------------------------------------------  Problem List Items Addressed This Visit    None    Visit Diagnoses    Facet arthritis of lumbosacral region [facility])       Relevant Medications   triamcinolone acetonide (KENALOG-40) injection 40 mg   sodium chloride flush (NS) 0.9 % injection 10 mL   ropivacaine (PF) 2 mg/mL (0.2%) (NAROPIN) injection 10 mL   lidocaine (PF) (XYLOCAINE) 1 % injection 5 mL   lactated ringers infusion 1,000 mL   fentaNYL (SUBLIMAZE) injection 100 mcg   triamcinolone acetonide (KENALOG-40) 40 MG/ML injection   orphenadrine (NORFLEX) 30 MG/ML  injection   oxyCODONE (ROXICODONE) 5 MG immediate release tablet   Other Relevant Orders   LUMBAR FACET(MEDIAL BRANCH NERVE BLOCK) MBNB   ToxASSURE Select 13 (MW), Urine   Chronic midline low back pain with left-sided sciatica       Relevant Medications   triamcinolone acetonide (KENALOG-40) injection 40 mg   fentaNYL (SUBLIMAZE) injection 100 mcg   triamcinolone acetonide (KENALOG-40) 40 MG/ML injection   orphenadrine (NORFLEX) 30 MG/ML injection   oxyCODONE (ROXICODONE) 5 MG immediate release tablet   Other Relevant Orders   LUMBAR FACET(MEDIAL BRANCH NERVE BLOCK) MBNB   ToxASSURE Select 13 (MW), Urine   Chronic midline low back pain without sciatica       Relevant Medications   triamcinolone acetonide (KENALOG-40) injection 40 mg   fentaNYL (SUBLIMAZE) injection 100 mcg   triamcinolone acetonide (KENALOG-40) 40 MG/ML injection   orphenadrine (NORFLEX) 30 MG/ML injection   oxyCODONE (ROXICODONE) 5 MG immediate release tablet      ----------------------------------------------------------------------------------------------------------------------  1. Chronic midline  low back pain without sciatica Discontinue Robaxin and start Flexeril 10 mg tablets 1 twice a day when necessary. A repeat urine drug screen was requested secondary to inconsistency on his most recent sampling  2. Facet arthritis of lumbosacral region We will proceed with a diagnostic bilateral lumbar facet block today. The risks and benefits once again reviewed all questions answered and no guarantees made. We'll have return to clinic in 1 month for reevaluation possible repeat injection at that time.  3. DDD (degenerative disc disease), lumbar Stretching and strengthening exercises  4. Low back derangement syndrome Continue stretching strengthening exercises as once again reviewed. Continue current medication management program with refills today.  I am having Mr. Tangen maintain his LORazepam, methocarbamol,  oxyCODONE-acetaminophen, and oxyCODONE. We will continue to administer triamcinolone acetonide, sodium chloride flush, ropivacaine (PF) 2 mg/mL (0.2%), lidocaine (PF), lactated ringers, and fentaNYL.   Meds ordered this encounter  Medications  . triamcinolone acetonide (KENALOG-40) injection 40 mg  . sodium chloride flush (NS) 0.9 % injection 10 mL  . ropivacaine (PF) 2 mg/mL (0.2%) (NAROPIN) injection 10 mL  . lidocaine (PF) (XYLOCAINE) 1 % injection 5 mL  . lactated ringers infusion 1,000 mL  . fentaNYL (SUBLIMAZE) injection 100 mcg  . triamcinolone acetonide (KENALOG-40) 40 MG/ML injection    Lowell Guitar, Patti: cabinet override  . midazolam (VERSED) 5 MG/5ML injection    Lowell Guitar, Patti: cabinet override  . orphenadrine (NORFLEX) 30 MG/ML injection    Lowell Guitar, Patti: cabinet override  . DISCONTD: oxyCODONE (ROXICODONE) 5 MG immediate release tablet    Sig: Take 1 tablet (5 mg total) by mouth 3 (three) times daily.    Dispense:  90 tablet    Refill:  0    Do not fill until [id number]  . oxyCODONE (ROXICODONE) 5 MG immediate release tablet    Sig: Take 1 tablet (5 mg total) by mouth 3 (three) times daily.    Dispense:  90 tablet    Refill:  0    Do not fill until [id number]     Procedure: Bilateral lumbar medial branch block to the facets at L3-4 L4-5 L5-S1 and S1 under fluoroscopic guidance with moderate sedation #1   Procedure: Bilateral lumbar medial branch block under fluoroscopic guidance at L3-4 L4-5 L5-S1 with sedation Patient was taken to the fluoroscopy suite and placed in the prone position. A total dose of 4 mg of Versed with 1 cc of fentanyl was titrated for moderate sedation. Vital signs were stable throughout the procedure. The  overlying area of skin on the  left left side was prepped with Betadine 3 and strict aseptic technique was utilized throughout the procedure. Flouroscopy was used to  identify the areas overlying the aforementioned facets at  L3-4 or L4-5 and  L5-S1. 1%  lidocaine 1 cc was infiltrated subcutaneously and into the fascia with a 25-gauge needle at each of these sites. I then advanced a 22-gauge 3-1/2 inch Quinckie needle with the needle tip to lie at the "[facility]" portion of the [employer] dog". There was negative aspiration for heme or CSF and  no paresthesia. I then injected 2 cc of ropivacaine 0.2% mixed with 10 mg of triamcinolone at each of the aforementioned sites. These needles were withdrawn and the L5-S1 needle was redirected towards the S1 posterior foramen. This was once again performed with no paresthesia, negative aspiration and 2 cc of this same mixture was injected at this site.   On the opposite side, I then injected 1%  lidocaine as above and advanced 22-gauge Quinky needles as before to the [place] eye portion of the [employer] dog". These needles were placed without paresthesia and with strict aseptic technique. As before I injected 2 cc of ropivacaine .2% mixed with 10 mg triamcinolone at each site without evidence of IV or subarachnoid symptoms. These needles were withdrawn and the L5-S1 needle was redirected towards the S1 posterior foramen and advanced without paresthesia with negative aspiration. Once again, I injected 2 cc of the same mixture at that site and the patient was convalesced discharged home in stable condition  Dr. Gwenyth Bender M.D.  Follow-up: Return in about 2 months (around 11/05/2016) for evaluation, procedure.    Yevette Edwards, MD

## 2016-09-08 ENCOUNTER — Telehealth: Payer: Self-pay

## 2016-09-08 NOTE — Telephone Encounter (Signed)
Post procedure phone call.  States he is sore.  Informed him to put heat on it and to expect to wait 4-10 days for the steroid to start working.  Instructed to call for any other problems.

## 2016-09-14 LAB — TOXASSURE SELECT 13 (MW), URINE

## 2017-01-23 ENCOUNTER — Emergency Department
Admission: EM | Admit: 2017-01-23 | Discharge: 2017-01-23 | Disposition: A | Discharge status: Home / Self Care | Payer: BLUE CROSS/BLUE SHIELD | Attending: Emergency Medicine | Admitting: Emergency Medicine

## 2017-01-23 ENCOUNTER — Encounter: Payer: Self-pay | Admitting: Medical Oncology

## 2017-01-23 DIAGNOSIS — I10 Essential (primary) hypertension: Secondary | ICD-10-CM | POA: Insufficient documentation

## 2017-01-23 DIAGNOSIS — K641 Second degree hemorrhoids: Secondary | ICD-10-CM | POA: Insufficient documentation

## 2017-01-23 DIAGNOSIS — F17210 Nicotine dependence, cigarettes, uncomplicated: Secondary | ICD-10-CM | POA: Insufficient documentation

## 2017-01-23 MED ORDER — HYDROCORTISONE ACETATE 25 MG RE SUPP: 25.0000 mg | Freq: Two times a day (BID) | RECTAL | 1 refills | Status: AC

## 2017-01-23 MED ORDER — HYDROCODONE-IBUPROFEN 5-200 MG PO TABS
1.0000 | ORAL_TABLET | Freq: Three times a day (TID) | ORAL | 0 refills | Status: DC | PRN
Start: 2017-01-23 — End: 2020-08-05

## 2017-01-23 MED ORDER — DIBUCAINE 1 % RE OINT: 1.0000 "application " | TOPICAL_OINTMENT | RECTAL | 1 refills | Status: AC | PRN

## 2017-01-23 NOTE — ED Notes (Signed)
Pt discharged to home.  Discharge instructions reviewed.  Verbalized understanding.  No questions or concerns at this time.  Teach back verified.  Pt in NAD.  No items left in ED.   

## 2017-01-23 NOTE — ED Triage Notes (Signed)
Pt toe d with c/o rectal pain that started after lifting heavy equipment yesterday.  Pt reports pain worse with urination and bm.  Hx of hemmorhoids

## 2017-01-23 NOTE — ED Provider Notes (Signed)
[facility] Emergency Department Provider Note   ____________________________________________   I have reviewed the triage vital signs and the nursing notes.   HISTORY  Chief Complaint Rectal Pain    HPI Brent Cuevas is a 31 y.o. male presents with rectal pain that started after lifting heavy equipment yesterday. Patient reported worsening pain with bowel movements. Patient has history of external hemorrhoids and noted current symptoms were similar to past symptoms. Patient has attempted self treatment with hemorrhoid suppositories, wipes, creams and ointments without relief. Patient denies fever, chills, headache, vision changes, chest pain, chest tightness, shortness of breath, abdominal pain, nausea and vomiting.  Past Medical History:  Diagnosis Date  . Hypertension   . Lumbar herniated disc     There are no active problems to display for this patient.   Past Surgical History:  Procedure Laterality Date  . HERNIA REPAIR     as an infant    Prior to Admission medications   Medication Sig Start Date End Date Taking? Authorizing Provider  dibucaine (NUPERCAINAL) 1 % OINT Place 1 application rectally as needed for pain. 01/23/17   Little, Traci M, PA-C  hydrocodone-ibuprofen (VICOPROFEN) 5-200 MG tablet Take 1 tablet by mouth every 8 (eight) hours as needed for pain. 01/23/17   Little, Traci M, PA-C  hydrocortisone (ANUSOL-HC) 25 MG suppository Place 1 suppository (25 mg total) rectally every 12 (twelve) hours. 01/23/17 01/23/18  Little, Traci M, PA-C  LORazepam (ATIVAN) 1 MG tablet Take 1 mg by mouth every 8 (eight) hours as needed for anxiety.    [provider]  methocarbamol (ROBAXIN) 750 MG tablet Take 1 tablet (750 mg total) by mouth 2 (two) times daily. Patient not taking: Reported on 09/07/2016 10/26/15   Yevette EdwardsAdams, James G, MD  oxyCODONE (ROXICODONE) 5 MG immediate release tablet Take 1 tablet (5 mg total) by mouth 3 (three) times daily.  09/07/16   Yevette EdwardsAdams, James G, MD  oxyCODONE-acetaminophen (PERCOCET) 5-325 MG tablet Take 1 tablet by mouth every 4 (four) hours as needed for severe pain. 12/24/15   Yevette EdwardsAdams, James G, MD    Allergies Patient has no known allergies.  Family History  Problem Relation Age of Onset  . Depression Maternal Grandmother   . Hypertension Maternal Grandmother   . Hypertension Paternal Grandmother   . Diabetes Paternal Grandmother   . Hypertension Paternal Grandfather   . Diabetes Paternal Grandfather     Social History Social History  Substance Use Topics  . Smoking status: Current Every Day Smoker    Packs/day: 1.00    Types: Cigarettes  . Smokeless tobacco: Never Used  . Alcohol use No    Review of Systems Constitutional: Negative for fever/chills Eyes: No visual changes. Cardiovascular: Denies chest pain. Respiratory: Denies cough Denies shortness of breath. Gastrointestinal:Rectal pain with rectal tissue swelling, likely hemorrhoid. Musculoskeletal: Negative for back pain. Negative for generalized body aches. Skin: Negative for rash. ____________________________________________   PHYSICAL EXAM:  VITAL SIGNS: ED Triage Vitals  Enc Vitals Group     BP 01/23/17 1658 (!) 158/92     Pulse Rate 01/23/17 1658 76     Resp 01/23/17 1658 18     Temp 01/23/17 1658 97.9 F (36.6 C)     Temp Source 01/23/17 1658 Oral     SpO2 01/23/17 1658 100 %     Weight 01/23/17 1655 245 lb (111.1 kg)     Height 01/23/17 1655 5\' 11"  (1.803 m)     Head Circumference --  Peak Flow --      Pain Score 01/23/17 1654 8     Pain Loc --      Pain Edu? --      Excl. in GC? --     Constitutional: Alert and oriented. Well appearing and in no acute distress.  Head: Normocephalic and atraumatic. Cardiovascular: Normal rate, regular rhythm. Normal distal pulses. Respiratory: Normal respiratory effort.  Gastrointestinal: Soft and nontender. External hemorrhoid tender to palpation with swelling, not  thrombosed along the 3 to 4:00 position of the rectum. Musculoskeletal: Nontender with normal range of motion in all extremities. Neurologic: Normal speech and language.  Skin:  Skin is warm, dry and intact. No rash noted. Psychiatric: Mood and affect are normal.  ____________________________________________   LABS (all labs ordered are listed, but only abnormal results are displayed)  Labs Reviewed - No data to display ____________________________________________  EKG None ____________________________________________  RADIOLOGY None ____________________________________________   PROCEDURES  Procedure(s) performed: No    Critical Care performed: no ____________________________________________   INITIAL IMPRESSION / ASSESSMENT AND PLAN / ED COURSE  Pertinent labs & imaging results that were available during my care of the patient were reviewed by me and considered in my medical decision making (see chart for details).  Patient presented with rectal pain that began 2 days ago after lifting heavy equipment at work. History of physical exam reassuring symptoms are consistent with external hemorrhoid without thrombosis. Patient will be given a prescription for dibucaine ointment, hydrocortisone suppositories, and Vicoprofen for symptoms management. Recommended patient follow-up with GI for continued care secondary to the fact the hemorrhoids are recurrence. Patient informed of clinical course, understand medical decision-making process, and agree with plan.  Patient was advised to follow up with gastroenterology and was also advised to return to the emergency department for symptoms that change or worsen.      If controlled substance prescribed during this visit, 12 month history viewed on the [facility] prior to issuing an initial prescription for Schedule II or III opiod. ____________________________________________   FINAL CLINICAL IMPRESSION(S) / ED DIAGNOSES  Final  diagnoses:  Grade II hemorrhoids       NEW MEDICATIONS STARTED DURING THIS VISIT:  Discharge Medication List as of 01/23/2017  6:45 PM    START taking these medications   Details  dibucaine (NUPERCAINAL) 1 % OINT Place 1 application rectally as needed for pain., Starting Tue 01/23/2017, Print    hydrocodone-ibuprofen (VICOPROFEN) 5-200 MG tablet Take 1 tablet by mouth every 8 (eight) hours as needed for pain., Starting Tue 01/23/2017, Print    hydrocortisone (ANUSOL-HC) 25 MG suppository Place 1 suppository (25 mg total) rectally every 12 (twelve) hours., Starting Tue 01/23/2017, Until Wed 01/23/2018, Print         Note:  This document was prepared using Dragon voice recognition software and may include unintentional dictation errors.    Clois Comber, PA-C 01/23/17 1919    Sharyn Creamer, MD 01/23/17 2329

## 2017-01-23 NOTE — ED Notes (Signed)
Pt states that he has a hx of hemorrhoids and feels like he has one approx the size of the tip of his thumb.  Pt states that he has had to have one cut out before and believes that his one is one of those as well.  Pt states he just wanted to make sure that it wasn't anything worse.  Pt states that there is a lot of pressure in his rectum.

## 2017-05-28 IMAGING — CT CT ABD-PELV W/ CM
2 of 4 series · 16 of 46 positions shown, 18 images · IV contrast (APPLIED)
Comparison: CT abdomen and pelvis January 14, 2014

CLINICAL DATA: Abdominal pain tonight ; RIGHT abdominal pain
radiating to RIGHT testis. Constipation. Several months of urinary
frequency. History of hernia repair.

EXAM:
CT ABDOMEN AND PELVIS WITH CONTRAST
TECHNIQUE: Multidetector CT imaging of the abdomen and pelvis was performed
using the standard protocol following bolus administration of
intravenous contrast.
CONTRAST:  125mL FZLSIH-E88 IOPAMIDOL (FZLSIH-E88) INJECTION 61%

[Series 2: axial st · axial · 0.91mm/px · z∈[-485,-10]mm · 13 of 105 slices shown, 15 images]
[im 5/105  soft-tissue]
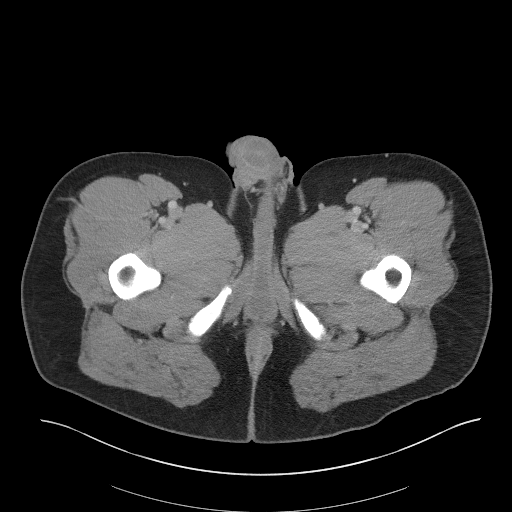
[im 5/105  bone]
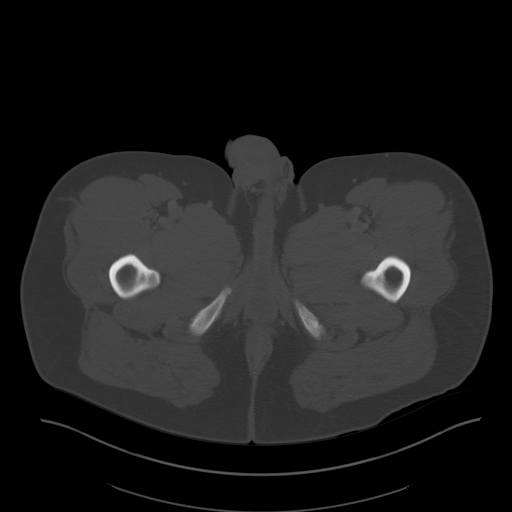
[im 14/105  soft-tissue]
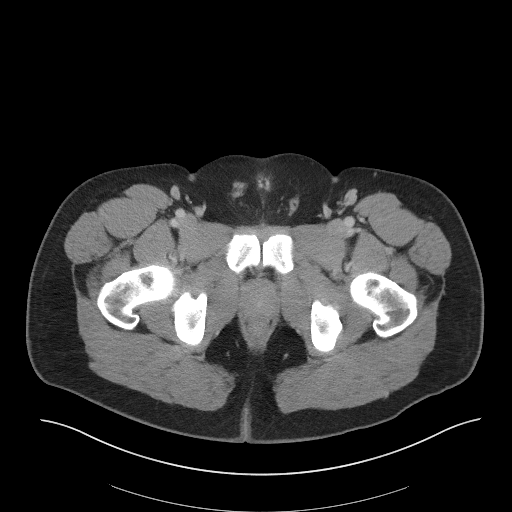
[im 22/105  soft-tissue]
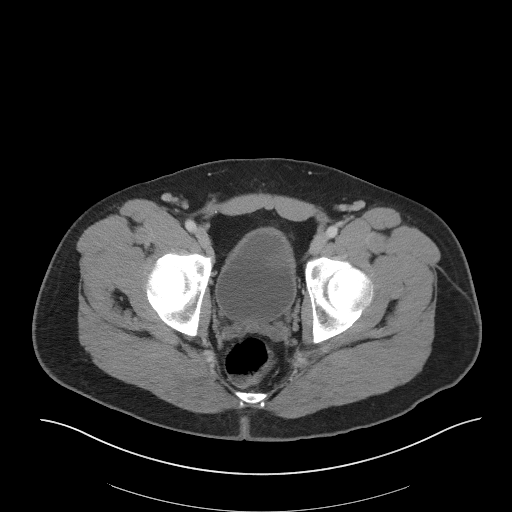
[im 31/105  soft-tissue]
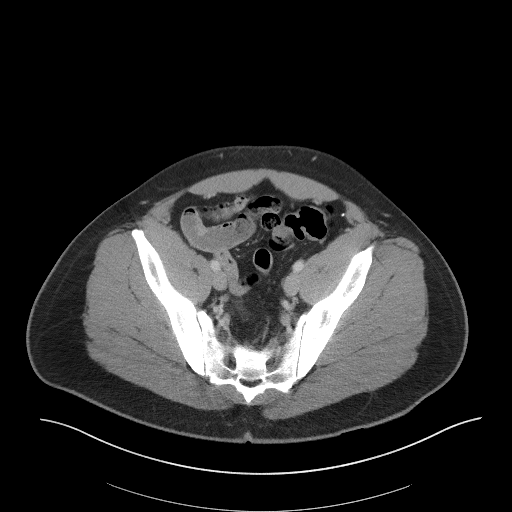
[im 35/105  soft-tissue]
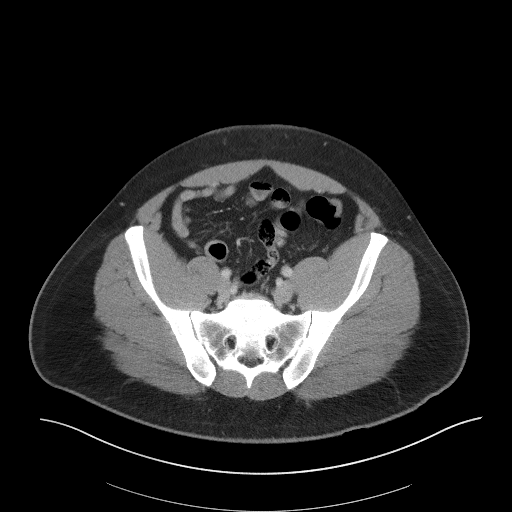
[im 44/105  soft-tissue]
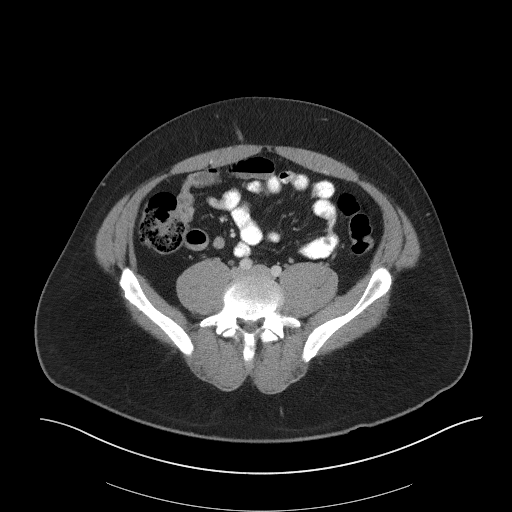
[im 53/105  soft-tissue]
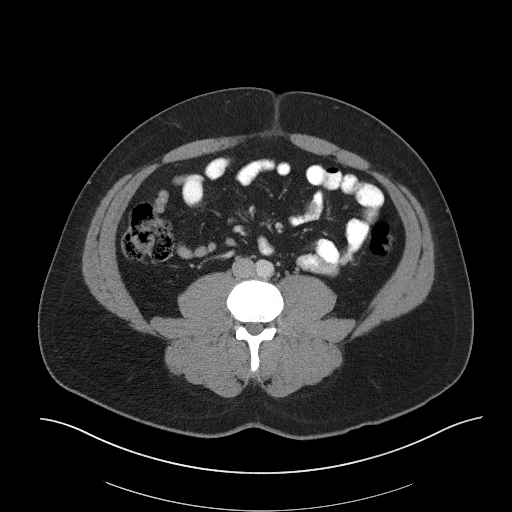
[im 61/105  soft-tissue]
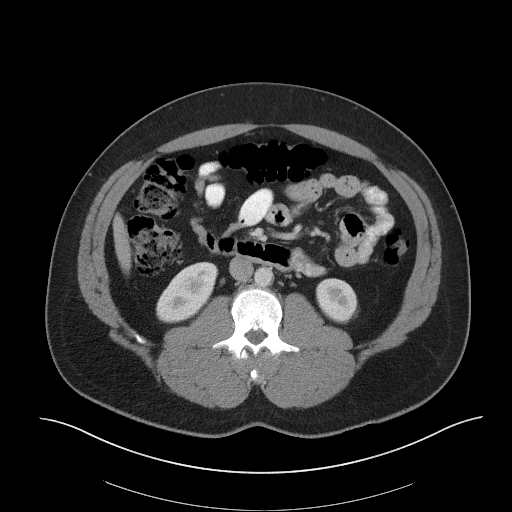
[im 70/105  soft-tissue]
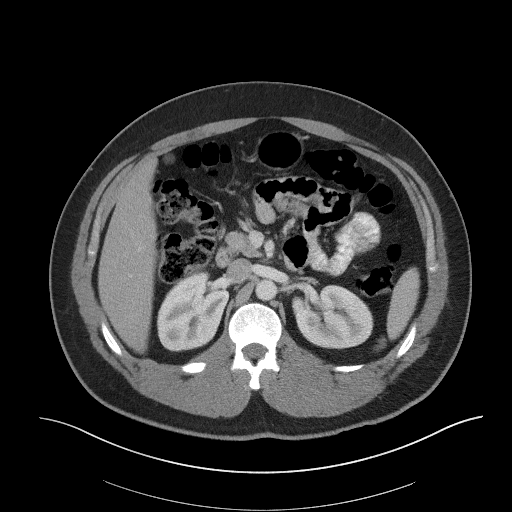
[im 70/105  bone]
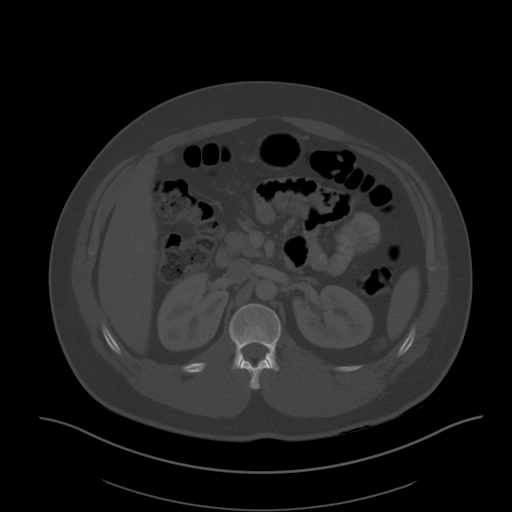
[im 74/105  soft-tissue]
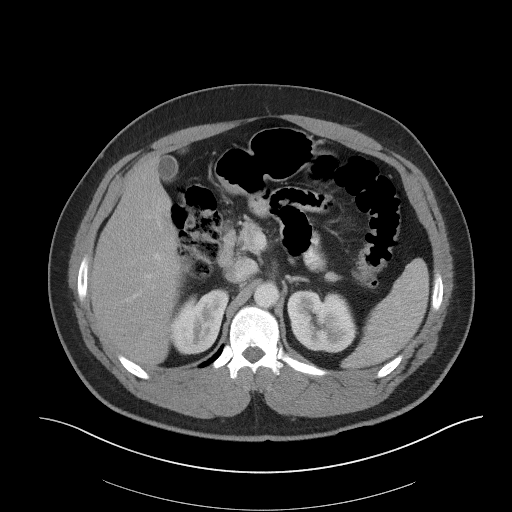
[im 83/105  soft-tissue]
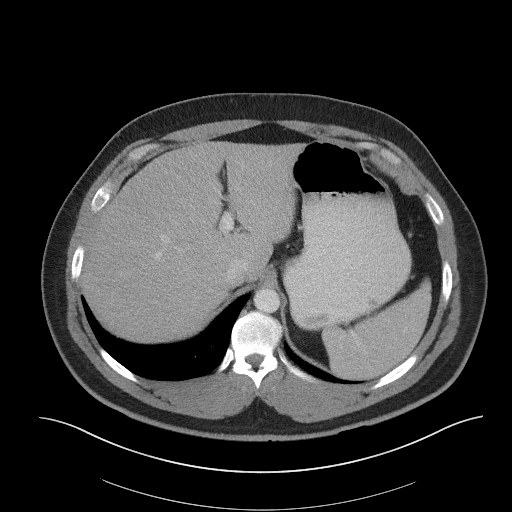
[im 92/105  soft-tissue]
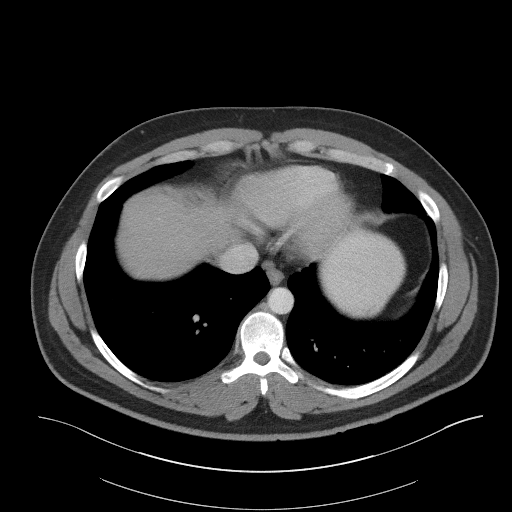
[im 100/105  soft-tissue]
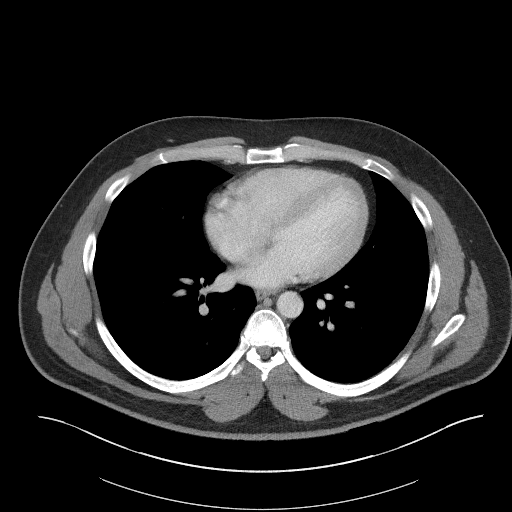

[Series 5: coronal st · coronal · 0.91mm/px · 3 of 103 slices shown]
[im 35/103  soft-tissue]
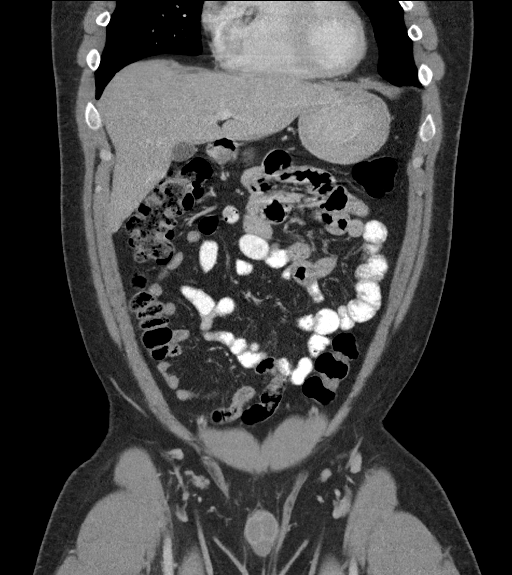
[im 46/103  soft-tissue]
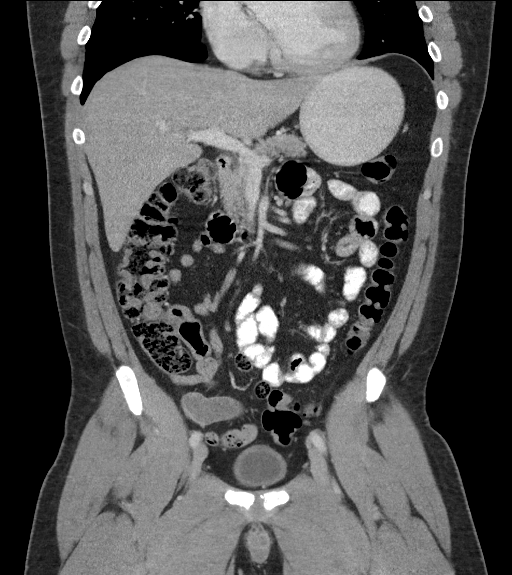
[im 57/103  soft-tissue]
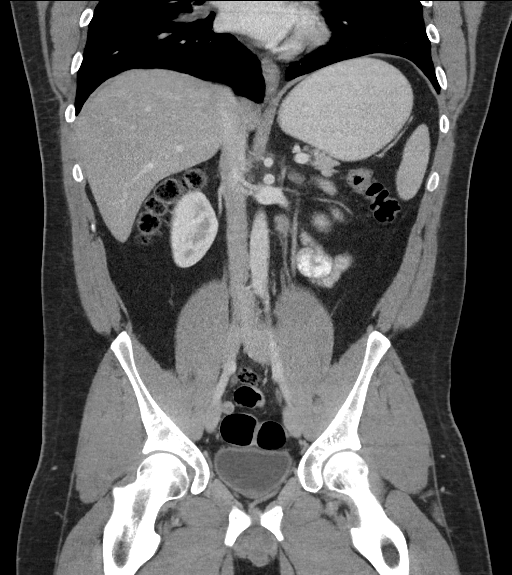

[16 of 46 positions shown; findings below may reference images not displayed]

FINDINGS: LOWER CHEST: Heterogeneous lung attenuation most compatible with
small airway disease. Sub cm RIGHT hilar lymph nodes may be
reactive. Included heart size is normal. No pericardial effusion.

HEPATOBILIARY: Liver and gallbladder are normal.

PANCREAS: Normal.

SPLEEN: Normal.

ADRENALS/URINARY TRACT: Kidneys are orthotopic, demonstrating
symmetric enhancement. No nephrolithiasis, hydronephrosis or solid
renal masses. The unopacified ureters are normal in course and
caliber. Urinary bladder is partially distended and unremarkable.
Normal adrenal glands.

STOMACH/BOWEL: The stomach, small and large bowel are normal in
course and caliber without inflammatory changes. Mild amount of
retained large bowel stool. Small amount of small bowel feces
compatible chronic stasis. Normal appendix.

VASCULAR/LYMPHATIC: Aortoiliac vessels are normal in course and
caliber. No lymphadenopathy by CT size criteria.

REPRODUCTIVE: Normal.

OTHER: No intraperitoneal free fluid or free air.

MUSCULOSKELETAL: Nonacute. Multilevel Schmorl's nodes. Small fat
containing umbilical hernia. Small L4-5 broad-based disc osteophyte
complex.
IMPRESSION: No acute intra-abdominal or pelvic process.  Normal appendix.

## 2017-06-23 ENCOUNTER — Emergency Department
Admission: EM | Admit: 2017-06-23 | Discharge: 2017-06-23 | Disposition: A | Discharge status: Home / Self Care | Payer: Self-pay | Attending: Emergency Medicine | Admitting: Emergency Medicine

## 2017-06-23 ENCOUNTER — Emergency Department: Payer: Self-pay

## 2017-06-23 DIAGNOSIS — I10 Essential (primary) hypertension: Secondary | ICD-10-CM | POA: Insufficient documentation

## 2017-06-23 DIAGNOSIS — J111 Influenza due to unidentified influenza virus with other respiratory manifestations: Secondary | ICD-10-CM | POA: Insufficient documentation

## 2017-06-23 DIAGNOSIS — F17210 Nicotine dependence, cigarettes, uncomplicated: Secondary | ICD-10-CM | POA: Insufficient documentation

## 2017-06-23 DIAGNOSIS — Z79899 Other long term (current) drug therapy: Secondary | ICD-10-CM | POA: Insufficient documentation

## 2017-06-23 LAB — CBC WITH DIFFERENTIAL/PLATELET
BASOS ABS: 0 10*3/uL (ref 0–0.1)
Basophils Relative: 0 %
Eosinophils Absolute: 0 10*3/uL (ref 0–0.7)
Eosinophils Relative: 0 %
HEMATOCRIT: 40.7 % (ref 40.0–52.0)
Hemoglobin: 14.2 g/dL (ref 13.0–18.0)
LYMPHS ABS: 0.9 10*3/uL — AB (ref 1.0–3.6)
Lymphocytes Relative: 11 %
MCH: 30.3 pg (ref 26.0–34.0)
MCHC: 34.8 g/dL (ref 32.0–36.0)
MCV: 87 fL (ref 80.0–100.0)
Monocytes Absolute: 0.7 10*3/uL (ref 0.2–1.0)
Monocytes Relative: 9 %
Neutro Abs: 6.1 10*3/uL (ref 1.4–6.5)
Neutrophils Relative %: 80 %
Platelets: 154 10*3/uL (ref 150–440)
RBC: 4.68 MIL/uL (ref 4.40–5.90)
RDW: 14.1 % (ref 11.5–14.5)
WBC: 7.7 10*3/uL (ref 3.8–10.6)

## 2017-06-23 LAB — COMPREHENSIVE METABOLIC PANEL
ALT: 37 U/L (ref 17–63)
AST: 38 U/L (ref 15–41)
Albumin: 4.1 g/dL (ref 3.5–5.0)
Alkaline Phosphatase: 68 U/L (ref 38–126)
Anion gap: 14 (ref 5–15)
BILIRUBIN TOTAL: 0.5 mg/dL (ref 0.3–1.2)
BUN: 11 mg/dL (ref 6–20)
CHLORIDE: 102 mmol/L (ref 101–111)
CO2: 24 mmol/L (ref 22–32)
CREATININE: 0.75 mg/dL (ref 0.61–1.24)
Calcium: 9.1 mg/dL (ref 8.9–10.3)
GFR calc Af Amer: >60 mL/min (ref >60)
GLUCOSE: 111 mg/dL — AB (ref 65–99)
Potassium: 3.8 mmol/L (ref 3.5–5.1)
Sodium: 140 mmol/L (ref 135–145)
TOTAL PROTEIN: 7.6 g/dL (ref 6.5–8.1)

## 2017-06-23 MED ORDER — SODIUM CHLORIDE 0.9 % IV BOLUS (SEPSIS)
1000.0000 mL | Freq: Once | INTRAVENOUS | Status: AC
  Administered 2017-06-23: 1000 mL via INTRAVENOUS

## 2017-06-23 MED ORDER — KETOROLAC TROMETHAMINE 30 MG/ML IJ SOLN
30.0000 mg | Freq: Once | INTRAMUSCULAR | Status: AC
  Administered 2017-06-23: 30 mg via INTRAVENOUS
  Filled 2017-06-23: qty 1

## 2017-06-23 NOTE — ED Triage Notes (Signed)
Pt states he was diagnosed with the flu on Thursday.  Pt states that when he coughs it feels like someone is ripping his chest open.  Pt states he is weaker and that he tried to take a bath earlier and had a hard time getting up afterwards.  Pt states he is currently on tamiflu.  Pt states he last took tylenol 500 mg 1130am and took dayquil at this same time.  Pt states that it hurts to take deep breaths, so he is taking more shallow more frequent breaths.  Pt is A&Ox4, too weak to walk.

## 2017-06-23 NOTE — ED Notes (Signed)

## 2017-06-23 NOTE — ED Provider Notes (Signed)
[facility] Emergency Department Provider Note   ____________________________________________   I have reviewed the triage vital signs and the nursing notes.   HISTORY  Chief Complaint Influenza and Weakness   History limited by: Not Limited   HPI Brent Cuevas is a 31 y.o. male who presents to the emergency department today because of concern for worsening flu symptoms.  LOCATION:generalized body aches, weakness to lower extremities DURATION:diagnosed with influenza 2 days ago TIMING: worsening SEVERITY: states he can barely get up QUALITY: body aches, weakness CONTEXT: patient states that he was diagnosed with flu 2 days ago. Started tamiflu. Today has felt particularly weak.  MODIFYING FACTORS: worse with exertion ASSOCIATED SYMPTOMS: patient has had cough and chest pain with cough. Has had fevers  Per medical record review patient has a history of recently diagnosed influenza type a.   Past Medical History:  Diagnosis Date  . Hypertension   . Lumbar herniated disc     There are no active problems to display for this patient.   Past Surgical History:  Procedure Laterality Date  . HERNIA REPAIR     as an infant    Prior to Admission medications   Medication Sig Start Date End Date Taking? Authorizing Provider  dibucaine (NUPERCAINAL) 1 % OINT Place 1 application rectally as needed for pain. 01/23/17   Little, Traci M, PA-C  hydrocodone-ibuprofen (VICOPROFEN) 5-200 MG tablet Take 1 tablet by mouth every 8 (eight) hours as needed for pain. 01/23/17   Little, Traci M, PA-C  hydrocortisone (ANUSOL-HC) 25 MG suppository Place 1 suppository (25 mg total) rectally every 12 (twelve) hours. 01/23/17 01/23/18  Little, Traci M, PA-C  LORazepam (ATIVAN) 1 MG tablet Take 1 mg by mouth every 8 (eight) hours as needed for anxiety.    [provider]  methocarbamol (ROBAXIN) 750 MG tablet Take 1 tablet (750 mg total) by mouth 2 (two) times  daily. Patient not taking: Reported on 09/07/2016 10/26/15   Yevette EdwardsAdams, James G, MD  oxyCODONE (ROXICODONE) 5 MG immediate release tablet Take 1 tablet (5 mg total) by mouth 3 (three) times daily. 09/07/16   Yevette EdwardsAdams, James G, MD  oxyCODONE-acetaminophen (PERCOCET) 5-325 MG tablet Take 1 tablet by mouth every 4 (four) hours as needed for severe pain. 12/24/15   Yevette EdwardsAdams, James G, MD    Allergies Patient has no known allergies.  Family History  Problem Relation Age of Onset  . Depression Maternal Grandmother   . Hypertension Maternal Grandmother   . Hypertension Paternal Grandmother   . Diabetes Paternal Grandmother   . Hypertension Paternal Grandfather   . Diabetes Paternal Grandfather     Social History Social History   Tobacco Use  . Smoking status: Current Every Day Smoker    Packs/day: 1.00    Types: Cigarettes  . Smokeless tobacco: Never Used  Substance Use Topics  . Alcohol use: No    Alcohol/week: 0.0 oz  . Drug use: No    Review of Systems Constitutional: Positive for fevers. Eyes: No visual changes. ENT: No sore throat. Cardiovascular: Positive for chest pain. Respiratory: Positive for cough. Gastrointestinal: Positive for decreased appetite.  Genitourinary: Negative for dysuria. Musculoskeletal: Positive for body aches. Skin: Negative for rash. Neurological: Positive for headaches.  ____________________________________________   PHYSICAL EXAM:  VITAL SIGNS: ED Triage Vitals  Enc Vitals Group     BP 06/23/17 1658 (!) 159/98     Pulse Rate 06/23/17 1658 99     Resp 06/23/17 1658 (!) 24  Temp 06/23/17 1658 (!) 100.4 F (38 C)     Temp Source 06/23/17 1658 Oral     SpO2 06/23/17 1658 94 %     Weight 06/23/17 1703 245 lb (111.1 kg)     Height --      Head Circumference --      Peak Flow --      Pain Score 06/23/17 1702 7   Constitutional: Alert and oriented. Well appearing and in no distress. Eyes: Conjunctivae are normal.  ENT   Head: Normocephalic and  atraumatic.   Nose: No congestion/rhinnorhea.   Mouth/Throat: Mucous membranes are moist.   Neck: No stridor. Hematological/Lymphatic/Immunilogical: No cervical lymphadenopathy. Cardiovascular: Tachycardic, regular rhythm.  No murmurs, rubs, or gallops.  Respiratory: Normal respiratory effort without tachypnea nor retractions. Breath sounds are clear and equal bilaterally. No wheezes/rales/rhonchi. Gastrointestinal: Soft and non tender. No rebound. No guarding.  Genitourinary: Deferred Musculoskeletal: Normal range of motion in all extremities. No lower extremity edema. Neurologic:  Normal speech and language. No gross focal neurologic deficits are appreciated.  Skin:  Skin is warm, dry and intact. No rash noted. Psychiatric: Mood and affect are normal. Speech and behavior are normal. Patient exhibits appropriate insight and judgment.  ____________________________________________    LABS (pertinent positives/negatives)  CMP wnl except glu 111 CBC wnl except ab lym 0.9  ____________________________________________   EKG  None  ____________________________________________    RADIOLOGY  CXR No acute findings  ____________________________________________   PROCEDURES  Procedures  ____________________________________________   INITIAL IMPRESSION / ASSESSMENT AND PLAN / ED COURSE  Pertinent labs & imaging results that were available during my care of the patient were reviewed by me and considered in my medical decision making (see chart for details).  Patient presents to the emergency department today feeling unwell.  Recent diagnosis of influenza.  Workup does not show any concerning leukocytosis, electrolyte abnormality or pneumonia.  Patient was given IV fluids here.  Do feel like patient is suffering from influenza symptoms.  Patient is on Tamiflu already.  Discussed findings with patient. ________________________________________   FINAL CLINICAL  IMPRESSION(S) / ED DIAGNOSES  Final diagnoses:  Influenza     Note: This dictation was prepared with Dragon dictation. Any transcriptional errors that result from this process are unintentional     Phineas SemenGoodman, Cameo Shewell, MD 06/23/17 2040

## 2017-06-23 NOTE — Discharge Instructions (Signed)
Please seek medical attention for any high fevers, chest pain, shortness of breath, change in behavior, persistent vomiting, bloody stool or any other new or concerning symptoms.  

## 2017-09-11 ENCOUNTER — Emergency Department
Admission: EM | Admit: 2017-09-11 | Discharge: 2017-09-11 | Disposition: A | Discharge status: Home / Self Care | Payer: Self-pay | Attending: Emergency Medicine | Admitting: Emergency Medicine

## 2017-09-11 ENCOUNTER — Other Ambulatory Visit: Payer: Self-pay

## 2017-09-11 ENCOUNTER — Encounter: Payer: Self-pay | Admitting: Emergency Medicine

## 2017-09-11 DIAGNOSIS — I10 Essential (primary) hypertension: Secondary | ICD-10-CM | POA: Insufficient documentation

## 2017-09-11 DIAGNOSIS — F410 Panic disorder [episodic paroxysmal anxiety] without agoraphobia: Secondary | ICD-10-CM | POA: Insufficient documentation

## 2017-09-11 DIAGNOSIS — Z87891 Personal history of nicotine dependence: Secondary | ICD-10-CM | POA: Insufficient documentation

## 2017-09-11 NOTE — ED Triage Notes (Addendum)
Pt arrives via [facility] from home s/p panic attack that began around 1145pm. Pt reportedly took 7.5mg  of methadone that is not prescribed to him. Pt worried that he will lose his job but in NAD at this time. Pt takes approx 60mg  percocet daily for hx of back pain from incident that occurred in [employer]. Pt states he ran out of percocet and took the methadone to avoid withdrawing from percocet.  Pt takes ativan for anxiety. Took total of .75mg  ativan today.

## 2017-09-11 NOTE — ED Notes (Signed)
ED Provider at bedside. 

## 2017-09-11 NOTE — ED Notes (Signed)
Pt ambulatory to wheel chair upon discharge. Verbalized understanding of discharge instructions and follow-up care. VSS. A&O x4. Skin warm and dry.

## 2017-09-11 NOTE — ED Provider Notes (Signed)
[facility] Emergency Department Provider Note       Time seen: ----------------------------------------- 1:30 PM on 09/11/2017 -----------------------------------------   I have reviewed the triage vital signs and the nursing notes.  HISTORY   Chief Complaint Panic Attack    HPI Brent Cuevas is a 32 y.o. male with a history of hypertension who presents to the ED for possible panic attack.  Patient states that symptoms began around 1145 this morning.  He took 7.5 mg of methadone that is not prescribed to him.  Patient is worried that he will lose his job.  He takes approximately 60 mg Percocet daily for his history of back pain from an incident that occurred while he was in the [employer].  Patient states he ran out of Percocet and took the methadone to avoid withdrawing from Percocet.  He takes Ativan for anxiety and took a total of 0.75 mg today.  He denies any recent illness or other complaints.  Past Medical History:  Diagnosis Date  . Hypertension   . Lumbar herniated disc     There are no active problems to display for this patient.   Past Surgical History:  Procedure Laterality Date  . HERNIA REPAIR     as an infant    Allergies Patient has no known allergies.  Social History Social History   Tobacco Use  . Smoking status: Former Smoker    Packs/day: 0.00  . Smokeless tobacco: Never Used  Substance Use Topics  . Alcohol use: No    Alcohol/week: 0.0 oz  . Drug use: No    Review of Systems Constitutional: Negative for fever. Cardiovascular: Negative for chest pain. Respiratory: Negative for shortness of breath. Gastrointestinal: Negative for abdominal pain, vomiting and diarrhea. Musculoskeletal: Positive for chronic back pain Skin: Negative for rash. Neurological: Positive for recent anxiety  All systems negative/normal/unremarkable except as stated in the HPI  ____________________________________________   PHYSICAL  EXAM:  VITAL SIGNS: ED Triage Vitals  Enc Vitals Group     BP 09/11/17 1252 (!) 155/94     Pulse Rate 09/11/17 1252 (!) 57     Resp 09/11/17 1252 18     Temp --      Temp src --      SpO2 09/11/17 1252 97 %     Weight 09/11/17 1255 260 lb (117.9 kg)     Height 09/11/17 1255 5\' 10"  (1.778 m)     Head Circumference --      Peak Flow --      Pain Score 09/11/17 1254 8     Pain Loc --      Pain Edu? --      Excl. in GC? --     Constitutional: Drowsy but oriented, well appearing and in no distress. Eyes: Conjunctivae are normal. Normal extraocular movements. ENT   Head: Normocephalic and atraumatic.   Nose: No congestion/rhinnorhea.   Mouth/Throat: Mucous membranes are moist.   Neck: No stridor. Cardiovascular: Normal rate, regular rhythm. No murmurs, rubs, or gallops. Respiratory: Normal respiratory effort without tachypnea nor retractions. Breath sounds are clear and equal bilaterally. No wheezes/rales/rhonchi. Gastrointestinal: Soft and nontender. Normal bowel sounds Musculoskeletal: Nontender with normal range of motion in extremities. No lower extremity tenderness nor edema. Neurologic:  Normal speech and language. No gross focal neurologic deficits are appreciated.  Skin:  Skin is warm, dry and intact. No rash noted. Psychiatric: Mood and affect are normal. Speech and behavior are normal.  ____________________________________________  EKG: Interpreted  by me.  Sinus rhythm the rate of 59 bpm, normal PR interval, normal QRS, normal QT.  ____________________________________________  ED COURSE:  As part of my medical decision making, I reviewed the following data within the electronic MEDICAL RECORD NUMBER History obtained from family if available, nursing notes, old chart and ekg, as well as notes from prior ED visits. Patient presented for possible anxiety attack occurred drug ingestion, he appears medically stable and will only require observation while in the  ER Procedures ____________________________________________  DIFFERENTIAL DIAGNOSIS   Drug ingestion, drug overdose, panic attack, anxiety, arrhythmia  FINAL ASSESSMENT AND PLAN  Panic attack   Plan: Patient had presented for a panic attack.  Patient is a normal examination and normal vitals cleared for outpatient follow-up.   Ulice DashJohnathan E Williams, MD   Note: This note was generated in part or whole with voice recognition software. Voice recognition is usually quite accurate but there are transcription errors that can and very often do occur. I apologize for any typographical errors that were not detected and corrected.    Emily FilbertWilliams, Jonathan E, MD 09/11/17 1332

## 2018-03-10 ENCOUNTER — Other Ambulatory Visit: Payer: Self-pay

## 2018-03-10 DIAGNOSIS — Z79899 Other long term (current) drug therapy: Secondary | ICD-10-CM | POA: Insufficient documentation

## 2018-03-10 DIAGNOSIS — M7989 Other specified soft tissue disorders: Secondary | ICD-10-CM | POA: Insufficient documentation

## 2018-03-10 DIAGNOSIS — I10 Essential (primary) hypertension: Secondary | ICD-10-CM | POA: Insufficient documentation

## 2018-03-10 DIAGNOSIS — S76212D Strain of adductor muscle, fascia and tendon of left thigh, subsequent encounter: Secondary | ICD-10-CM | POA: Insufficient documentation

## 2018-03-10 DIAGNOSIS — Z87891 Personal history of nicotine dependence: Secondary | ICD-10-CM | POA: Insufficient documentation

## 2018-03-10 DIAGNOSIS — X58XXXD Exposure to other specified factors, subsequent encounter: Secondary | ICD-10-CM | POA: Insufficient documentation

## 2018-03-10 NOTE — ED Triage Notes (Signed)
Patient reports left hip pain for several weeks, reports now has a pressure feeling in his left calf.  Patient reports seen at [facility] ED last night and told nothing was broken and to follow up with regular MD.  Patient is ambulatory with slow gait and limp noted.

## 2018-03-11 ENCOUNTER — Emergency Department: Payer: Self-pay

## 2018-03-11 ENCOUNTER — Emergency Department
Admission: EM | Admit: 2018-03-11 | Discharge: 2018-03-11 | Disposition: A | Discharge status: Home / Self Care | Payer: Self-pay | Attending: Emergency Medicine | Admitting: Emergency Medicine

## 2018-03-11 DIAGNOSIS — S76212D Strain of adductor muscle, fascia and tendon of left thigh, subsequent encounter: Secondary | ICD-10-CM

## 2018-03-11 MED ORDER — HYDROCODONE-ACETAMINOPHEN 5-325 MG PO TABS: 2.0000 | ORAL_TABLET | Freq: Once | ORAL | Status: DC

## 2018-03-11 MED ORDER — HYDROCODONE-ACETAMINOPHEN 5-325 MG PO TABS: 1.0000 | ORAL_TABLET | Freq: Four times a day (QID) | ORAL | 0 refills | Status: DC | PRN

## 2018-03-11 NOTE — ED Notes (Signed)
Returned to waiting room from ultrasound

## 2018-03-11 NOTE — ED Notes (Signed)
Patient updated regarding wait time, verbalized understanding. 

## 2018-03-11 NOTE — ED Notes (Signed)
Patient observed walking outside. 

## 2018-03-11 NOTE — ED Provider Notes (Signed)
[facility] Emergency Department Provider Note  ____________________________________________   First MD Initiated Contact with Patient 03/11/18 [phone]     (approximate)  I have reviewed the triage vital signs and the nursing notes.   HISTORY  Chief Complaint Hip Pain and Leg Pain   HPI Brent Cuevas is a 32 y.o. male who comes to the emergency department with several weeks of left hip pain and left calf pain.  The pain is moderate to severe seems to be rating down the lateral aspect of his left leg towards his calf and then back up.  He was seen at [facility] ER last night and had x-rays which were reportedly "normal" and to follow-up with his primary care physician.  He comes to the emergency department today for second opinion.  He is never had a DVT or pulmonary embolism.  No recent surgery travel or immobilization.  No discoloration to his leg.  Denies trauma.    Past Medical History:  Diagnosis Date  . Hypertension   . Lumbar herniated disc     There are no active problems to display for this patient.   Past Surgical History:  Procedure Laterality Date  . HERNIA REPAIR     as an infant    Prior to Admission medications   Medication Sig Start Date End Date Taking? Authorizing Provider  busPIRone (BUSPAR) 10 MG tablet Take 1 tablet by mouth 2 (two) times daily. 07/26/17   [provider]  dibucaine (NUPERCAINAL) 1 % OINT Place 1 application rectally as needed for pain. 01/23/17   Little, Traci M, PA-C  HYDROcodone-acetaminophen (NORCO) 5-325 MG tablet Take 1 tablet by mouth every 6 (six) hours as needed for up to 15 doses for severe pain. 03/11/18   Merrily Brittleifenbark, Leiana Rund, MD  hydrocodone-ibuprofen (VICOPROFEN) 5-200 MG tablet Take 1 tablet by mouth every 8 (eight) hours as needed for pain. 01/23/17   Little, Traci M, PA-C  LORazepam (ATIVAN) 1 MG tablet Take 1 mg by mouth every 8 (eight) hours as needed for anxiety.    [provider]    methocarbamol (ROBAXIN) 750 MG tablet Take 1 tablet (750 mg total) by mouth 2 (two) times daily. Patient not taking: Reported on 09/07/2016 10/26/15   Yevette EdwardsAdams, James G, MD  oxyCODONE (ROXICODONE) 5 MG immediate release tablet Take 1 tablet (5 mg total) by mouth 3 (three) times daily. 09/07/16   Yevette EdwardsAdams, James G, MD  oxyCODONE-acetaminophen (PERCOCET) 5-325 MG tablet Take 1 tablet by mouth every 4 (four) hours as needed for severe pain. 12/24/15   Yevette EdwardsAdams, James G, MD    Allergies Patient has no known allergies.  Family History  Problem Relation Age of Onset  . Depression Maternal Grandmother   . Hypertension Maternal Grandmother   . Hypertension Paternal Grandmother   . Diabetes Paternal Grandmother   . Hypertension Paternal Grandfather   . Diabetes Paternal Grandfather     Social History Social History   Tobacco Use  . Smoking status: Former Smoker    Packs/day: 0.00  . Smokeless tobacco: Never Used  Substance Use Topics  . Alcohol use: No    Alcohol/week: 0.0 standard drinks  . Drug use: No    Review of Systems Constitutional: No fever/chills Cardiovascular: Denies chest pain. Respiratory: Denies shortness of breath. Gastrointestinal: No abdominal pain.  No nausea, no vomiting.  Musculoskeletal: Positive for leg pain Neurological: Negative for headaches   ____________________________________________   PHYSICAL EXAM:  VITAL SIGNS: ED Triage Vitals  Enc  Vitals Group     BP 03/11/18 0010 (!) 161/99     Pulse Rate 03/11/18 0010 86     Resp 03/11/18 0010 18     Temp 03/11/18 0010 98.7 F (37.1 C)     Temp Source 03/11/18 0010 Oral     SpO2 03/11/18 0010 97 %     Weight 03/10/18 2357 260 lb (117.9 kg)     Height 03/10/18 2357 5\' 10"  (1.778 m)     Head Circumference --      Peak Flow --      Pain Score 03/10/18 2356 7     Pain Loc --      Pain Edu? --      Excl. in GC? --     Constitutional: Alert and oriented x4 appears quite uncomfortable groaning in pain holding  his left hip Head: Atraumatic. Nose: No congestion/rhinnorhea. Mouth/Throat: No trismus Neck: No stridor.   Cardiovascular: Regular rate and rhythm Respiratory: Normal respiratory effort.  No retractions. MSK: No midline back tenderness he does have discomfort when ranging left hip although neurovascularly intact Neurologic:  Normal speech and language. No gross focal neurologic deficits are appreciated.  Skin:  Skin is warm, dry and intact. No rash noted.    ____________________________________________  LABS (all labs ordered are listed, but only abnormal results are displayed)  Labs Reviewed - No data to display   __________________________________________  EKG   ____________________________________________  RADIOLOGY  Ultrasound of the left lower extremity reviewed by me with no acute disease CT of the lumbar spine and pelvis reviewed by me with no acute disease ____________________________________________   DIFFERENTIAL includes but not limited to  DVT, radiculopathy, subacute fracture   PROCEDURES  Procedure(s) performed: no  Procedures  Critical Care performed: no  ____________________________________________   INITIAL IMPRESSION / ASSESSMENT AND PLAN / ED COURSE  Pertinent labs & imaging results that were available during my care of the patient were reviewed by me and considered in my medical decision making (see chart for details).   As part of my medical decision making, I reviewed the following data within the electronic MEDICAL RECORD NUMBER History obtained from family if available, nursing notes, old chart and ekg, as well as notes from prior ED visits.  The patient arrives uncomfortable appearing with left leg pain.  Ultrasound is reassuring but given his persistent discomfort and negative recent x-rays will progress onto CT imaging.  Patient does not have a ride home so I cannot give him any opioids now.  Fortunately CTs are reassuring.  I  encouraged the patient to follow-up with orthopedic surgery for recheck and further evaluation and likely physical therapy.      ____________________________________________   FINAL CLINICAL IMPRESSION(S) / ED DIAGNOSES  Final diagnoses:  Groin strain, left, subsequent encounter      NEW MEDICATIONS STARTED DURING THIS VISIT:  Discharge Medication List as of 03/11/2018  5:38 AM    START taking these medications   Details  HYDROcodone-acetaminophen (NORCO) 5-325 MG tablet Take 1 tablet by mouth every 6 (six) hours as needed for up to 15 doses for severe pain., Starting Mon 03/11/2018, Print         Note:  This document was prepared using Dragon voice recognition software and may include unintentional dictation errors.      Merrily Brittleifenbark, Carmilla Granville, MD 03/15/18 [phone]

## 2018-03-11 NOTE — ED Notes (Signed)
Pt says he's had pain for over a month; says [place] he hasn't seen in 6 years and didn't want to cancel that trip and that's why he hasn't been seen before now; pt adds [facility], they did an xray and told him nothing is broken; he was given a Lidoderm patch and Toradol IM with no relief; pt says tonight he doesn't want us to "dope him up", "in fact I don't want you to give me anything"; say he just wants us to find out what's causing his pain;

## 2018-03-11 NOTE — Discharge Instructions (Signed)
It was a pleasure to take care of you today, and thank you for coming to our emergency department.  If you have any questions or concerns before leaving please ask the nurse to grab me and I'm more than happy to go through your aftercare instructions again.  If you were prescribed any opioid pain medication today such as Norco, Vicodin, Percocet, morphine, hydrocodone, or oxycodone please make sure you do not drive when you are taking this medication as it can alter your ability to drive safely.  If you have any concerns once you are home that you are not improving or are in fact getting worse before you can make it to your follow-up appointment, please do not hesitate to call 911 and come back for further evaluation.  Merrily Brittle, MD  Results for orders placed or performed during the hospital encounter of 06/23/17  Comprehensive metabolic panel  Result Value Ref Range   Sodium 140 135 - 145 mmol/L   Potassium 3.8 3.5 - 5.1 mmol/L   Chloride 102 101 - 111 mmol/L   CO2 24 22 - 32 mmol/L   Glucose, Bld 111 (H) 65 - 99 mg/dL   BUN 11 6 - 20 mg/dL   Creatinine, Ser 1.61 0.61 - 1.24 mg/dL   Calcium 9.1 8.9 - 09.6 mg/dL   Total Protein 7.6 6.5 - 8.1 g/dL   Albumin 4.1 3.5 - 5.0 g/dL   AST 38 15 - 41 U/L   ALT 37 17 - 63 U/L   Alkaline Phosphatase 68 38 - 126 U/L   Total Bilirubin 0.5 0.3 - 1.2 mg/dL   GFR calc non Af Amer >60 >60 mL/min   GFR calc Af Amer >60 >60 mL/min   Anion gap 14 5 - 15  CBC with Differential  Result Value Ref Range   WBC 7.7 3.8 - 10.6 K/uL   RBC 4.68 4.40 - 5.90 MIL/uL   Hemoglobin 14.2 13.0 - 18.0 g/dL   HCT 04.5 40.9 - 81.1 %   MCV 87.0 80.0 - 100.0 fL   MCH 30.3 26.0 - 34.0 pg   MCHC 34.8 32.0 - 36.0 g/dL   RDW 91.4 78.2 - 95.6 %   Platelets 154 150 - 440 K/uL   Neutrophils Relative % 80 %   Neutro Abs 6.1 1.4 - 6.5 K/uL   Lymphocytes Relative 11 %   Lymphs Abs 0.9 (L) 1.0 - 3.6 K/uL   Monocytes Relative 9 %   Monocytes Absolute 0.7 0.2 - 1.0 K/uL    Eosinophils Relative 0 %   Eosinophils Absolute 0.0 0 - 0.7 K/uL   Basophils Relative 0 %   Basophils Absolute 0.0 0 - 0.1 K/uL   Ct Lumbar Spine Wo Contrast  Result Date: 03/11/2018 CLINICAL DATA:  Initial evaluation for left hip pain for 1 month, back pain. EXAM: CT LUMBAR SPINE WITHOUT CONTRAST CT PELVIS WITHOUT CONTRAST TECHNIQUE: Multidetector CT imaging of the lumbar spine and pelvis was performed without intravenous contrast administration. Multiplanar CT image reconstructions were also generated. COMPARISON:  None. FINDINGS: CT LUMBAR SPINE FINDINGS Segmentation: Normal segmentation. Lowest well-formed disc labeled the L5-S1 level. Alignment: Trace 2 mm retrolisthesis of L5 on S1. Alignment otherwise normal with preservation of the normal lumbar lordosis. Vertebrae: Vertebral body heights well maintained without evidence for acute or chronic fracture. Visualized sacrum and pelvis intact. SI joints approximated symmetric. No discrete lytic or blastic osseous lesions. Paraspinal and other soft tissues: Paraspinous soft tissues within normal limits. Mild aortic atherosclerosis.  Visualized visceral structures otherwise unremarkable. Disc levels: L1-2:  Unremarkable. L2-3:  Unremarkable. L3-4:  Unremarkable. L4-5: Diffuse disc bulge with annular calcification. Minimal endplate spurring. No significant canal or foraminal stenosis identified. L5-S1:  Minor annular disc bulge.  No significant stenosis. CT PELVIS FINDINGS: Visualized bowels within normal limits without obstruction or acute inflammatory changes. Normal appendix. Urinary bladder normal. Prostate normal. No free air or fluid within the pelvis. No significant inguinal hernia. Small fat containing paraumbilical hernia noted. No adenopathy. Gluteus musculature symmetric and normal. Musculature about the visualized hips symmetric and within normal limits. Osseous structures within normal limits. No acute or chronic fracture. SI joints approximated  and symmetric without evidence for sacroiliitis. Pubis symphysis within normal limits. Femoral heads in normal alignment within the acetabula femoral head heights maintained. No imaging findings to suggest AVN. Joint spaces fairly well-maintained with no evidence for significant osteoarthritic changes about the hips. Few scattered benign bone islands noted. No discrete lytic or blastic osseous lesions. IMPRESSION: 1. Mild disc bulging at L4-5 and L5-S1 without significant stenosis. Otherwise negative CT of the lumbar spine. 2. Negative CT of the pelvis. No acute abnormality identified. No significant osteoarthritic changes about the hips bilaterally. Electronically Signed   By: Rise MuBenjamin  McClintock M.D.   On: 03/11/2018 05:07   Ct Pelvis Wo Contrast  Result Date: 03/11/2018 CLINICAL DATA:  Initial evaluation for left hip pain for 1 month, back pain. EXAM: CT LUMBAR SPINE WITHOUT CONTRAST CT PELVIS WITHOUT CONTRAST TECHNIQUE: Multidetector CT imaging of the lumbar spine and pelvis was performed without intravenous contrast administration. Multiplanar CT image reconstructions were also generated. COMPARISON:  None. FINDINGS: CT LUMBAR SPINE FINDINGS Segmentation: Normal segmentation. Lowest well-formed disc labeled the L5-S1 level. Alignment: Trace 2 mm retrolisthesis of L5 on S1. Alignment otherwise normal with preservation of the normal lumbar lordosis. Vertebrae: Vertebral body heights well maintained without evidence for acute or chronic fracture. Visualized sacrum and pelvis intact. SI joints approximated symmetric. No discrete lytic or blastic osseous lesions. Paraspinal and other soft tissues: Paraspinous soft tissues within normal limits. Mild aortic atherosclerosis. Visualized visceral structures otherwise unremarkable. Disc levels: L1-2:  Unremarkable. L2-3:  Unremarkable. L3-4:  Unremarkable. L4-5: Diffuse disc bulge with annular calcification. Minimal endplate spurring. No significant canal or  foraminal stenosis identified. L5-S1:  Minor annular disc bulge.  No significant stenosis. CT PELVIS FINDINGS: Visualized bowels within normal limits without obstruction or acute inflammatory changes. Normal appendix. Urinary bladder normal. Prostate normal. No free air or fluid within the pelvis. No significant inguinal hernia. Small fat containing paraumbilical hernia noted. No adenopathy. Gluteus musculature symmetric and normal. Musculature about the visualized hips symmetric and within normal limits. Osseous structures within normal limits. No acute or chronic fracture. SI joints approximated and symmetric without evidence for sacroiliitis. Pubis symphysis within normal limits. Femoral heads in normal alignment within the acetabula femoral head heights maintained. No imaging findings to suggest AVN. Joint spaces fairly well-maintained with no evidence for significant osteoarthritic changes about the hips. Few scattered benign bone islands noted. No discrete lytic or blastic osseous lesions. IMPRESSION: 1. Mild disc bulging at L4-5 and L5-S1 without significant stenosis. Otherwise negative CT of the lumbar spine. 2. Negative CT of the pelvis. No acute abnormality identified. No significant osteoarthritic changes about the hips bilaterally. Electronically Signed   By: Rise MuBenjamin  McClintock M.D.   On: 03/11/2018 05:07   [place] Venous Img Lower Unilateral Left  Result Date: 03/11/2018 CLINICAL DATA:  Pain and pressure with swelling to the left  calf for 1 month EXAM: Left LOWER EXTREMITY VENOUS DOPPLER ULTRASOUND TECHNIQUE: Gray-scale sonography with graded compression, as well as color Doppler and duplex ultrasound were performed to evaluate the lower extremity deep venous systems from the level of the common femoral vein and including the common femoral, femoral, profunda femoral, popliteal and calf veins including the posterior tibial, peroneal and gastrocnemius veins when visible. The superficial great saphenous  vein was also interrogated. Spectral Doppler was utilized to evaluate flow at rest and with distal augmentation maneuvers in the common femoral, femoral and popliteal veins. COMPARISON:  None. FINDINGS: Contralateral Common Femoral Vein: Respiratory phasicity is normal and symmetric with the symptomatic side. No evidence of thrombus. Normal compressibility. Common Femoral Vein: No evidence of thrombus. Normal compressibility, respiratory phasicity and response to augmentation. Saphenofemoral Junction: No evidence of thrombus. Normal compressibility and flow on color Doppler imaging. Profunda Femoral Vein: No evidence of thrombus. Normal compressibility and flow on color Doppler imaging. Femoral Vein: No evidence of thrombus. Normal compressibility, respiratory phasicity and response to augmentation. Popliteal Vein: No evidence of thrombus. Normal compressibility, respiratory phasicity and response to augmentation. Calf Veins: No evidence of thrombus. Normal compressibility and flow on color Doppler imaging. Other Findings:  None. IMPRESSION: No evidence of deep venous thrombosis. Electronically Signed   By: Jasmine PangKim  Fujinaga M.D.   On: 03/11/2018 01:38

## 2018-03-11 NOTE — ED Notes (Signed)
Patient transported to Ultrasound via wheelchair.

## 2018-03-11 NOTE — ED Notes (Signed)
Reviewed discharge instructions, follow-up care, and prescriptions with patient. Patient verbalized understanding of all information reviewed. Patient stable, with no distress noted at this time.    

## 2020-08-05 ENCOUNTER — Other Ambulatory Visit: Payer: Self-pay

## 2020-08-05 ENCOUNTER — Emergency Department
Admission: EM | Admit: 2020-08-05 | Discharge: 2020-08-05 | Disposition: A | Discharge status: Home / Self Care | Payer: Self-pay | Attending: Emergency Medicine | Admitting: Emergency Medicine

## 2020-08-05 ENCOUNTER — Emergency Department: Payer: Self-pay

## 2020-08-05 DIAGNOSIS — R0602 Shortness of breath: Secondary | ICD-10-CM

## 2020-08-05 DIAGNOSIS — R001 Bradycardia, unspecified: Secondary | ICD-10-CM | POA: Insufficient documentation

## 2020-08-05 DIAGNOSIS — Z87891 Personal history of nicotine dependence: Secondary | ICD-10-CM | POA: Insufficient documentation

## 2020-08-05 DIAGNOSIS — I10 Essential (primary) hypertension: Secondary | ICD-10-CM

## 2020-08-05 DIAGNOSIS — Z20822 Contact with and (suspected) exposure to covid-19: Secondary | ICD-10-CM

## 2020-08-05 DIAGNOSIS — R059 Cough, unspecified: Secondary | ICD-10-CM

## 2020-08-05 DIAGNOSIS — R0789 Other chest pain: Secondary | ICD-10-CM

## 2020-08-05 DIAGNOSIS — M791 Myalgia, unspecified site: Secondary | ICD-10-CM

## 2020-08-05 LAB — HEPATIC FUNCTION PANEL
ALT: 20 U/L (ref 0–44)
AST: 16 U/L (ref 15–41)
Albumin: 4.2 g/dL (ref 3.5–5.0)
Alkaline Phosphatase: 72 U/L (ref 38–126)
Bilirubin, Direct: <0.1 mg/dL (ref 0.0–0.2)
Total Bilirubin: 0.8 mg/dL (ref 0.3–1.2)
Total Protein: 7.5 g/dL (ref 6.5–8.1)

## 2020-08-05 LAB — URINALYSIS, COMPLETE (UACMP) WITH MICROSCOPIC
Bacteria, UA: NONE SEEN
Bilirubin Urine: NEGATIVE
Glucose, UA: NEGATIVE mg/dL
Hgb urine dipstick: NEGATIVE
Ketones, ur: NEGATIVE mg/dL
Leukocytes,Ua: NEGATIVE
Nitrite: NEGATIVE
Protein, ur: NEGATIVE mg/dL
Specific Gravity, Urine: 1.01 (ref 1.005–1.030)
Squamous Epithelial / LPF: NONE SEEN (ref 0–5)
pH: 6 (ref 5.0–8.0)

## 2020-08-05 LAB — BASIC METABOLIC PANEL
Anion gap: 13 (ref 5–15)
BUN: 12 mg/dL (ref 6–20)
CO2: 27 mmol/L (ref 22–32)
Calcium: 9.3 mg/dL (ref 8.9–10.3)
Chloride: 99 mmol/L (ref 98–111)
Creatinine, Ser: 0.85 mg/dL (ref 0.61–1.24)
GFR, Estimated: >60 mL/min (ref >60)
Glucose, Bld: 92 mg/dL (ref 70–99)
Potassium: 3.5 mmol/L (ref 3.5–5.1)
Sodium: 139 mmol/L (ref 135–145)

## 2020-08-05 LAB — CBC
HCT: 41.2 % (ref 39.0–52.0)
Hemoglobin: 14 g/dL (ref 13.0–17.0)
MCH: 29.9 pg (ref 26.0–34.0)
MCHC: 34 g/dL (ref 30.0–36.0)
MCV: 88 fL (ref 80.0–100.0)
Platelets: 242 10*3/uL (ref 150–400)
RBC: 4.68 MIL/uL (ref 4.22–5.81)
RDW: 13.2 % (ref 11.5–15.5)
WBC: 11.5 10*3/uL — ABNORMAL HIGH (ref 4.0–10.5)
nRBC: 0 % (ref 0.0–0.2)

## 2020-08-05 LAB — PROCALCITONIN: Procalcitonin: <0.1 ng/mL

## 2020-08-05 LAB — BRAIN NATRIURETIC PEPTIDE: B Natriuretic Peptide: 36.1 pg/mL (ref 0.0–100.0)

## 2020-08-05 LAB — TROPONIN I (HIGH SENSITIVITY): Troponin I (High Sensitivity): 5 ng/L (ref <18)

## 2020-08-05 NOTE — ED Triage Notes (Signed)
Pt to ED for chief complaint of cough for one month and shob for 2 days.  Pt denies chest pain but states having pressure around rib cage.  Speaking in complete sentences, NAD noted

## 2020-08-05 NOTE — ED Provider Notes (Signed)
[facility] Emergency Department Provider Note  ____________________________________________   Event Date/Time   First MD Initiated Contact with Patient 08/05/20 1504     (approximate)  I have reviewed the triage vital signs and the nursing notes.   HISTORY  Chief Complaint Shortness of Breath   HPI Brent Cuevas is a 35 y.o. male with a past medical history of hypertension and hematuria and proteinuria as well as tobacco abuse who presents for assessment of approximately 2 days of shortness of breath and some chest tightness associated with approximately 1 month of some shortness of breath.  Patient also endorses some intermittent discomfort in his left lower back over the last several months and was told he has had blood in his urine in the past but not currently have any back pain and does not have any gross blood in his urine.  He has never been evaluated for a kidney stone.  He denies any abdominal pain at this time although states he sometimes has some crampy abdominal pain.  No earache, sore throat, eye pain, back pain, abdominal pain today, acute diarrhea, urinary symptoms, rash or focal extremity pain.  No recent falls or injuries.  Patient denies regular EtOH use.  He does note he gets very little sleep secondary to his job.         Past Medical History:  Diagnosis Date  . Hypertension   . Lumbar herniated disc     There are no problems to display for this patient.   Past Surgical History:  Procedure Laterality Date  . HERNIA REPAIR     as an infant    Prior to Admission medications   Medication Sig Start Date End Date Taking? Authorizing Provider  busPIRone (BUSPAR) 10 MG tablet Take 1 tablet by mouth 2 (two) times daily. 07/26/17   [provider]  dibucaine (NUPERCAINAL) 1 % OINT Place 1 application rectally as needed for pain. 01/23/17   Little, Traci M, PA-C  LORazepam (ATIVAN) 1 MG tablet Take 1 mg by mouth every 8  (eight) hours as needed for anxiety.    [provider]    Allergies Augmentin [amoxicillin-pot clavulanate]  Family History  Problem Relation Age of Onset  . Depression Maternal Grandmother   . Hypertension Maternal Grandmother   . Hypertension Paternal Grandmother   . Diabetes Paternal Grandmother   . Hypertension Paternal Grandfather   . Diabetes Paternal Grandfather     Social History Social History   Tobacco Use  . Smoking status: Former Smoker    Packs/day: 0.00  . Smokeless tobacco: Never Used  Substance Use Topics  . Alcohol use: No    Alcohol/week: 0.0 standard drinks  . Drug use: No    Review of Systems  Review of Systems  Constitutional: Positive for malaise/fatigue. Negative for chills and fever.  HENT: Negative for sore throat.   Eyes: Negative for pain.  Respiratory: Positive for cough and shortness of breath. Negative for stridor.   Cardiovascular: Positive for chest pain.  Gastrointestinal: Negative for vomiting.  Genitourinary: Positive for dysuria ( intermittently over last several months).  Musculoskeletal: Positive for back pain ( intermittently over last several months, non today).  Skin: Negative for rash.  Neurological: Positive for headaches ( intermittently ). Negative for seizures and loss of consciousness.  Psychiatric/Behavioral: Negative for suicidal ideas.  All other systems reviewed and are negative.     ____________________________________________   PHYSICAL EXAM:  VITAL SIGNS: ED Triage Vitals  Enc  Vitals Group     BP 08/05/20 1319 (!) 162/99     Pulse Rate 08/05/20 1317 74     Resp 08/05/20 1317 20     Temp 08/05/20 1317 97.9 F (36.6 C)     Temp Source 08/05/20 1317 Oral     SpO2 08/05/20 1317 95 %     Weight 08/05/20 1319 240 lb (108.9 kg)     Height 08/05/20 1319 5\' 10"  (1.778 m)     Head Circumference --      Peak Flow --      Pain Score 08/05/20 1318 6     Pain Loc --      Pain Edu? --      Excl. in  GC? --    Vitals:   08/05/20 1317 08/05/20 1319  BP:  (!) 162/99  Pulse: 74   Resp: 20   Temp: 97.9 F (36.6 C)   SpO2: 95%    Physical Exam Vitals and nursing note reviewed.  Constitutional:      Appearance: He is well-developed and well-nourished.  HENT:     Head: Normocephalic and atraumatic.     Right Ear: External ear normal.     Left Ear: External ear normal.     Nose: Nose normal.     Mouth/Throat:     Mouth: Mucous membranes are dry.  Eyes:     Conjunctiva/sclera: Conjunctivae normal.  Cardiovascular:     Rate and Rhythm: Normal rate and regular rhythm.     Heart sounds: No murmur heard.   Pulmonary:     Effort: Pulmonary effort is normal. No respiratory distress.     Breath sounds: Normal breath sounds. No decreased breath sounds.  Abdominal:     Palpations: Abdomen is soft.     Tenderness: There is no abdominal tenderness.  Musculoskeletal:        General: No edema.     Cervical back: Neck supple.  Skin:    General: Skin is warm and dry.     Capillary Refill: Capillary refill takes less than 2 seconds.  Neurological:     Mental Status: He is alert and oriented to person, place, and time.  Psychiatric:        Mood and Affect: Mood and affect and mood normal.      ____________________________________________   LABS (all labs ordered are listed, but only abnormal results are displayed)  Labs Reviewed  CBC - Abnormal; Notable for the following components:      Result Value   WBC 11.5 (*)    All other components within normal limits  URINALYSIS, COMPLETE (UACMP) WITH MICROSCOPIC - Abnormal; Notable for the following components:   Color, Urine STRAW (*)    APPearance CLEAR (*)    All other components within normal limits  SARS CORONAVIRUS 2 (TAT 6-24 HRS)  BASIC METABOLIC PANEL  BRAIN NATRIURETIC PEPTIDE  HEPATIC FUNCTION PANEL  PROCALCITONIN  TROPONIN I (HIGH SENSITIVITY)  TROPONIN I (HIGH SENSITIVITY)    ____________________________________________  EKG  Sinus bradycardia with ventricular rate of 59, normal axis, unremarkable levels, no evidence of acute ischemia or other underlying arrhythmia. ____________________________________________  RADIOLOGY  ED MD interpretation: No focal consolidation, large effusion, infarcts, edema, or dyspnea standing, or other acute intrathoracic process.  Official radiology report(s): DG Chest 2 View  Result Date: 08/05/2020 CLINICAL DATA:  Cough for 1 month.  Short of breath for 2 days. EXAM: CHEST - 2 VIEW COMPARISON:  06/23/2017 FINDINGS: The heart size and  mediastinal contours are within normal limits. Both lungs are clear. No pleural effusion or pneumothorax. The visualized skeletal structures are unremarkable. IMPRESSION: No active cardiopulmonary disease. Electronically Signed   By: Amie Portland M.D.   On: 08/05/2020 14:08    ____________________________________________   PROCEDURES  Procedure(s) performed (including Critical Care):  Procedures   ____________________________________________   INITIAL IMPRESSION / ASSESSMENT AND PLAN / ED COURSE      Patient presents with both the history exam for assessment of some chest tightness associated with myalgias, cough and some shortness of breath.  Seems the shortness of breath has been present for at least a couple weeks and is not significantly different today although chest tightness and cough is.  On arrival patient is hypertensive with a BP of 162/99 with otherwise stable vital signs on room air.   With regard to patient's shortness of breath over the last couple of weeks associate with cough and some chest tightness for the last 2 days primary differential includes but is not limited to viral bronchitis, pneumonia, heart failure, ACS, myocarditis, pericarditis, pleurisy, PE, and GI etiologies.  Patient reports some intermittent discomfort with burning as well as being told that he  has had blood in his urine before but this is going on for several weeks and he has not had any gross blood.  In addition his abdomen is soft nontender throughout and he has not had any back pain in several days.  Unclear etiology although low suspicion any immediate life-threatening pathology patient has no findings on exam to suggest pyelonephritis or other immediately life-threatening intra-abdominal process.  UA today does not have any blood or evidence of infection.  ECG does not show evidence of ischemia and given nonelevated troponin pain greater than 3 hours after symptom onset low suspicion for ACS and myocarditis.  Chest x-ray has no evidence of pneumonia, pneumothorax, volume overload or other acute extensive process.  BNP is not suggestive of heart failure.  BMP shows no significant acute metabolic derangements.  CBC shows very slight leukocytosis with WBC count of 11.5 with normal hemoglobin and platelets.  This is nonspecific and could certainly be consistent with viral bronchitis.  LFTs are unremarkable and there is no evidence of cholestasis.  COVID PCR sent.  Advised patient to get adequate sleep and take his blood pressure medication as advised.  Advised him to have his blood pressure rechecked 1 to 2 weeks.  Given otherwise stable vital signs and reassuring exam and work-up I believe he is safe for discharge with plan for outpatient PCP follow-up.  Discharged stable condition.  Strict return precautions advised and discussed.  ____________________________________________   FINAL CLINICAL IMPRESSION(S) / ED DIAGNOSES  Final diagnoses:  SOB (shortness of breath)  Chest tightness  Cough  Hypertension, unspecified type  Person under investigation for COVID-19  Myalgia    Medications - No data to display   ED Discharge Orders    None       Note:  This document was prepared using Dragon voice recognition software and may include unintentional dictation errors.   Gilles Chiquito, MD 08/05/20 [phone]

## 2020-08-06 LAB — SARS CORONAVIRUS 2 (TAT 6-24 HRS): SARS Coronavirus 2: NEGATIVE

## 2020-10-16 ENCOUNTER — Other Ambulatory Visit: Payer: Self-pay

## 2020-10-16 ENCOUNTER — Emergency Department
Admission: EM | Admit: 2020-10-16 | Discharge: 2020-10-16 | Disposition: A | Discharge status: Home / Self Care | Payer: Self-pay | Attending: Emergency Medicine | Admitting: Emergency Medicine

## 2020-10-16 DIAGNOSIS — T402X5A Adverse effect of other opioids, initial encounter: Secondary | ICD-10-CM | POA: Insufficient documentation

## 2020-10-16 DIAGNOSIS — R400 Somnolence: Secondary | ICD-10-CM | POA: Insufficient documentation

## 2020-10-16 DIAGNOSIS — T50905A Adverse effect of unspecified drugs, medicaments and biological substances, initial encounter: Secondary | ICD-10-CM

## 2020-10-16 DIAGNOSIS — I10 Essential (primary) hypertension: Secondary | ICD-10-CM | POA: Insufficient documentation

## 2020-10-16 DIAGNOSIS — Z8739 Personal history of other diseases of the musculoskeletal system and connective tissue: Secondary | ICD-10-CM | POA: Insufficient documentation

## 2020-10-16 DIAGNOSIS — Z87891 Personal history of nicotine dependence: Secondary | ICD-10-CM | POA: Insufficient documentation

## 2020-10-16 DIAGNOSIS — M549 Dorsalgia, unspecified: Secondary | ICD-10-CM | POA: Insufficient documentation

## 2020-10-16 LAB — CBC WITH DIFFERENTIAL/PLATELET
Abs Immature Granulocytes: 0.03 10*3/uL (ref 0.00–0.07)
Basophils Absolute: 0 10*3/uL (ref 0.0–0.1)
Basophils Relative: 1 %
Eosinophils Absolute: 0.2 10*3/uL (ref 0.0–0.5)
Eosinophils Relative: 2 %
HCT: 43.8 % (ref 39.0–52.0)
Hemoglobin: 15 g/dL (ref 13.0–17.0)
Immature Granulocytes: 0 %
Lymphocytes Relative: 24 %
Lymphs Abs: 1.9 10*3/uL (ref 0.7–4.0)
MCH: 29.7 pg (ref 26.0–34.0)
MCHC: 34.2 g/dL (ref 30.0–36.0)
MCV: 86.7 fL (ref 80.0–100.0)
Monocytes Absolute: 0.5 10*3/uL (ref 0.1–1.0)
Monocytes Relative: 6 %
Neutro Abs: 5.4 10*3/uL (ref 1.7–7.7)
Neutrophils Relative %: 67 %
Platelets: 213 10*3/uL (ref 150–400)
RBC: 5.05 MIL/uL (ref 4.22–5.81)
RDW: 13.2 % (ref 11.5–15.5)
WBC: 8.1 10*3/uL (ref 4.0–10.5)
nRBC: 0 % (ref 0.0–0.2)

## 2020-10-16 LAB — COMPREHENSIVE METABOLIC PANEL
ALT: 22 U/L (ref 0–44)
AST: 24 U/L (ref 15–41)
Albumin: 4.4 g/dL (ref 3.5–5.0)
Alkaline Phosphatase: 74 U/L (ref 38–126)
Anion gap: 8 (ref 5–15)
BUN: 19 mg/dL (ref 6–20)
CO2: 26 mmol/L (ref 22–32)
Calcium: 9.2 mg/dL (ref 8.9–10.3)
Chloride: 103 mmol/L (ref 98–111)
Creatinine, Ser: 0.93 mg/dL (ref 0.61–1.24)
GFR, Estimated: >60 mL/min (ref >60)
Glucose, Bld: 155 mg/dL — ABNORMAL HIGH (ref 70–99)
Potassium: 3.5 mmol/L (ref 3.5–5.1)
Sodium: 137 mmol/L (ref 135–145)
Total Bilirubin: 0.6 mg/dL (ref 0.3–1.2)
Total Protein: 7.8 g/dL (ref 6.5–8.1)

## 2020-10-16 LAB — URINE DRUG SCREEN, QUALITATIVE (ARMC ONLY)
Amphetamines, Ur Screen: NOT DETECTED
Barbiturates, Ur Screen: NOT DETECTED
Benzodiazepine, Ur Scrn: NOT DETECTED
Cannabinoid 50 Ng, Ur ~~LOC~~: POSITIVE — AB
Cocaine Metabolite,Ur ~~LOC~~: NOT DETECTED
MDMA (Ecstasy)Ur Screen: NOT DETECTED
Methadone Scn, Ur: NOT DETECTED
Opiate, Ur Screen: NOT DETECTED
Phencyclidine (PCP) Ur S: NOT DETECTED
Tricyclic, Ur Screen: NOT DETECTED

## 2020-10-16 MED ORDER — SODIUM CHLORIDE 0.9 % IV BOLUS
1000.0000 mL | Freq: Once | INTRAVENOUS | Status: AC
  Administered 2020-10-16: 1000 mL via INTRAVENOUS

## 2020-10-16 MED ORDER — ONDANSETRON HCL 4 MG/2ML IJ SOLN
4.0000 mg | Freq: Once | INTRAMUSCULAR | Status: AC
  Administered 2020-10-16: 4 mg via INTRAVENOUS
  Filled 2020-10-16: qty 2

## 2020-10-16 NOTE — ED Notes (Signed)
Pt drowsy. When placed on pulse oximetry O2 saturation dropped to 88% pt placed on 2 L [place]. Md made aware

## 2020-10-16 NOTE — ED Notes (Signed)
Pt states he feels better now.

## 2020-10-16 NOTE — ED Triage Notes (Signed)
Pt states he was out of his percocet and took one of his buddies- pt states he normally takes 37, but hid friends was a 30 so he only took a quarter of it- pt found out that the medication he took was not prescribed to his friend- pt states it made his feel dizzy and sleepy

## 2020-10-16 NOTE — ED Provider Notes (Signed)
[facility] Emergency Department Provider Note  ____________________________________________   Event Date/Time   First MD Initiated Contact with Patient 10/16/20 1027     (approximate)  I have reviewed the triage vital signs and the nursing notes.   HISTORY  Chief Complaint Medication Reaction    HPI Brent Cuevas is a 35 y.o. male presents emergency department complaining of drowsiness and sleepiness after taking a supposedly oxycodone this morning around 8 AM.  Patient was at his friend's house and was complaining of  back pain.  He told patient that he had oxycodone 30 mg.  Patient cut it into 4 different pieces and took 1 piece.  He states he has never felt this way with his normal medication.  He is afraid he took something besides oxycodone.  He denies chest pain or shortness of breath.  Patient has history of sleep apnea   Past Medical History:  Diagnosis Date  . Hypertension   . Lumbar herniated disc     There are no problems to display for this patient.   Past Surgical History:  Procedure Laterality Date  . HERNIA REPAIR     as an infant    Prior to Admission medications   Medication Sig Start Date End Date Taking? Authorizing Provider  busPIRone (BUSPAR) 10 MG tablet Take 1 tablet by mouth 2 (two) times daily. 07/26/17   [provider]  dibucaine (NUPERCAINAL) 1 % OINT Place 1 application rectally as needed for pain. 01/23/17   Little, Traci M, PA-C  LORazepam (ATIVAN) 1 MG tablet Take 1 mg by mouth every 8 (eight) hours as needed for anxiety.    [provider]    Allergies Augmentin [amoxicillin-pot clavulanate]  Family History  Problem Relation Age of Onset  . Depression Maternal Grandmother   . Hypertension Maternal Grandmother   . Hypertension Paternal Grandmother   . Diabetes Paternal Grandmother   . Hypertension Paternal Grandfather   . Diabetes Paternal Grandfather     Social History Social  History   Tobacco Use  . Smoking status: Former Smoker    Packs/day: 0.00  . Smokeless tobacco: Never Used  Substance Use Topics  . Alcohol use: No    Alcohol/week: 0.0 standard drinks  . Drug use: No    Review of Systems  Constitutional: No fever/chills Eyes: No visual changes. ENT: No sore throat. Respiratory: Denies cough Cardiovascular: Denies chest pain Gastrointestinal: Denies abdominal pain Genitourinary: Negative for dysuria. Musculoskeletal: Negative for back pain. Skin: Negative for rash. Psychiatric: no mood changes,     ____________________________________________   PHYSICAL EXAM:  VITAL SIGNS: ED Triage Vitals [10/16/20 0959]  Enc Vitals Group     BP (!) 149/102     Pulse Rate 81     Resp 18     Temp 97.8 F (36.6 C)     Temp Source Oral     SpO2 99 %     Weight 250 lb (113.4 kg)     Height 5\' 10"  (1.778 m)     Head Circumference      Peak Flow      Pain Score 0     Pain Loc      Pain Edu?      Excl. in GC?     Constitutional: Alert and oriented. Well appearing and in no acute distress.  Patient is slightly drowsy but is answering questions appropriately Eyes: Conjunctivae are normal.  Head: Atraumatic. Nose: No congestion/rhinnorhea. Mouth/Throat: Mucous membranes are  moist.   Neck:  supple no lymphadenopathy noted Cardiovascular: Normal rate, regular rhythm. Heart sounds are normal Respiratory: Normal respiratory effort.  No retractions, lungs c t a  GU: deferred Musculoskeletal: FROM all extremities, warm and well perfused Neurologic:  Normal speech and language.  Patient is able to answer all questions appropriately.  No slurred speech. Skin:  Skin is warm, dry and intact. No rash noted. Psychiatric: Mood and affect are normal. Speech and behavior are normal.  ____________________________________________   LABS (all labs ordered are listed, but only abnormal results are displayed)  Labs Reviewed  URINE DRUG SCREEN, QUALITATIVE  ([facility] ONLY) - Abnormal; Notable for the following components:      Result Value   Cannabinoid 50 Ng, Ur Donna POSITIVE (*)    All other components within normal limits  COMPREHENSIVE METABOLIC PANEL - Abnormal; Notable for the following components:   Glucose, Bld 155 (*)    All other components within normal limits  CBC WITH DIFFERENTIAL/PLATELET   ____________________________________________   ____________________________________________  RADIOLOGY    ____________________________________________   PROCEDURES  Procedure(s) performed: No  Procedures    ____________________________________________   INITIAL IMPRESSION / ASSESSMENT AND PLAN / ED COURSE  Pertinent labs & imaging results that were available during my care of the patient were reviewed by me and considered in my medical decision making (see chart for details).   Patient is 36 year old male presents with medication reaction secondary to taking somebody else's narcotic.  See HPI.  Physical exam shows patient appears drowsy but stable.  UDS, CBC, metabolic panel, EKG  EKG shows bradycardia, see physician read, UDS shows marijuana  No patient was given normal saline 1 L IV  Patient describes the pill is light blue with a.m. on it which is typical of a 30 mg OxyContin.  Explained to the patient the OxyContin itself is stronger than oxycodone.  This may be why he feels differently.  He is still able to answer questions appropriately.  While sleeping his oxygen did drop in ~the low 90s/high 80s, put him on 2 L of O2.  I do feel this is probably more likely attributed to his sleep apnea combined with the marijuana/oxycodone.   ----------------------------------------- 12:51 PM on 10/16/2020 -----------------------------------------  Patient's labs are reassuring.  Patient's vitals have remained stable.  He states he is feeling much better.  He was discharged stable condition.  Did counsel him on taking other peoples  medications.  Next time it could be something more different.  States he understands.  Is discharged stable condition  Brent Cuevas was evaluated in Emergency Department on 10/16/2020 for the symptoms described in the history of present illness. He was evaluated in the context of the global COVID-19 pandemic, which necessitated consideration that the patient might be at risk for infection with the SARS-CoV-2 virus that causes COVID-19. Institutional protocols and algorithms that pertain to the evaluation of patients at risk for COVID-19 are in a state of rapid change based on information released by regulatory bodies including the CDC and federal and state organizations. These policies and algorithms were followed during the patient's care in the ED.    As part of my medical decision making, I reviewed the following data within the electronic MEDICAL RECORD NUMBER Nursing notes reviewed and incorporated, Labs reviewed , EKG interpreted bradycardia, see physician read, Old chart reviewed, Notes from prior ED visits and [place] Controlled Substance Database  ____________________________________________   FINAL CLINICAL IMPRESSION(S) / ED DIAGNOSES  Final diagnoses:  Medication reaction, initial encounter      NEW MEDICATIONS STARTED DURING THIS VISIT:  New Prescriptions   No medications on file     Note:  This document was prepared using Dragon voice recognition software and may include unintentional dictation errors.    Faythe Ghee, PA-C 10/16/20 1251    Sharyn Creamer, MD 10/16/20 [phone]

## 2020-10-16 NOTE — ED Triage Notes (Signed)
First Nurse Note:  Arrives states usually takes percocet 10/325.  States ran out of percocet, so took a pill that his friend gave him.  Medication was supposed to be Percocet 7.5.  Patient states he cut pill into quarters, and only took one quarter, but now feels sleepy.  AAOx3.  Skin warm and dry. NAD

## 2020-10-16 NOTE — ED Provider Notes (Signed)
EKG Reviewed entered by me at 1045 Heart rate 60 QRS 100 QTc 400 Normal sinus rhythm no evidence of ischemia or ectopy.   Sharyn Creamer, MD 10/16/20 [phone]

## 2021-07-18 ENCOUNTER — Emergency Department
Admission: EM | Admit: 2021-07-18 | Discharge: 2021-07-18 | Disposition: A | Discharge status: Home / Self Care | Payer: Self-pay | Attending: Emergency Medicine | Admitting: Emergency Medicine

## 2021-07-18 ENCOUNTER — Other Ambulatory Visit: Payer: Self-pay

## 2021-07-18 ENCOUNTER — Encounter: Payer: Self-pay | Admitting: Emergency Medicine

## 2021-07-18 ENCOUNTER — Emergency Department: Payer: Self-pay

## 2021-07-18 DIAGNOSIS — S61213A Laceration without foreign body of left middle finger without damage to nail, initial encounter: Secondary | ICD-10-CM | POA: Insufficient documentation

## 2021-07-18 DIAGNOSIS — W278XXA Contact with other nonpowered hand tool, initial encounter: Secondary | ICD-10-CM | POA: Insufficient documentation

## 2021-07-18 DIAGNOSIS — Y990 Civilian activity done for income or pay: Secondary | ICD-10-CM | POA: Insufficient documentation

## 2021-07-18 DIAGNOSIS — I10 Essential (primary) hypertension: Secondary | ICD-10-CM | POA: Insufficient documentation

## 2021-07-18 DIAGNOSIS — Z87891 Personal history of nicotine dependence: Secondary | ICD-10-CM | POA: Insufficient documentation

## 2021-07-18 MED ORDER — LIDOCAINE HCL (PF) 1 % IJ SOLN
INTRAMUSCULAR | Status: AC
  Administered 2021-07-18: 16:00:00 10 mL via INTRADERMAL
  Filled 2021-07-18: qty 5

## 2021-07-18 MED ORDER — ONDANSETRON HCL 4 MG PO TABS: 4.0000 mg | ORAL_TABLET | Freq: Three times a day (TID) | ORAL | 0 refills | Status: AC | PRN

## 2021-07-18 MED ORDER — LIDOCAINE HCL 1 % IJ SOLN
10.0000 mL | Freq: Once | INTRAMUSCULAR | Status: AC
  Administered 2021-07-18: 15:00:00 10 mL via INTRADERMAL

## 2021-07-18 MED ORDER — DOXYCYCLINE HYCLATE 100 MG PO TABS
100.0000 mg | ORAL_TABLET | Freq: Once | ORAL | Status: AC
  Administered 2021-07-18: 16:00:00 100 mg via ORAL
  Filled 2021-07-18: qty 1

## 2021-07-18 MED ORDER — OXYCODONE-ACETAMINOPHEN 5-325 MG PO TABS: 1.0000 | ORAL_TABLET | Freq: Four times a day (QID) | ORAL | 0 refills | Status: AC | PRN

## 2021-07-18 MED ORDER — ONDANSETRON 4 MG PO TBDP
4.0000 mg | ORAL_TABLET | Freq: Once | ORAL | Status: AC
  Administered 2021-07-18: 16:00:00 4 mg via ORAL
  Filled 2021-07-18: qty 1

## 2021-07-18 MED ORDER — LIDOCAINE HCL (PF) 1 % IJ SOLN
INTRAMUSCULAR | Status: AC
  Administered 2021-07-18: 15:00:00 10 mL via INTRADERMAL
  Filled 2021-07-18: qty 5

## 2021-07-18 MED ORDER — LIDOCAINE HCL 1 % IJ SOLN: 10.0000 mL | Freq: Once | INTRAMUSCULAR | Status: AC

## 2021-07-18 MED ORDER — TETANUS-DIPHTH-ACELL PERTUSSIS 5-2.5-18.5 LF-MCG/0.5 IM SUSY
0.5000 mL | PREFILLED_SYRINGE | Freq: Once | INTRAMUSCULAR | Status: DC
  Filled 2021-07-18: qty 0.5

## 2021-07-18 MED ORDER — DOXYCYCLINE HYCLATE 100 MG PO CAPS: 100.0000 mg | ORAL_CAPSULE | Freq: Two times a day (BID) | ORAL | 0 refills | Status: AC

## 2021-07-18 MED ORDER — OXYCODONE-ACETAMINOPHEN 5-325 MG PO TABS
2.0000 | ORAL_TABLET | Freq: Once | ORAL | Status: AC
  Administered 2021-07-18: 16:00:00 2 via ORAL
  Filled 2021-07-18: qty 2

## 2021-07-18 NOTE — ED Provider Notes (Signed)
[facility] Emergency Department Provider Note  ____________________________________________   Event Date/Time   First MD Initiated Contact with Patient 07/18/21 1512     (approximate)  I have reviewed the triage vital signs and the nursing notes.   HISTORY  Chief Complaint Laceration (/)    HPI Brent Cuevas is a 35 y.o. male  with h/o HTN, herniated lumbar disk here with middle finger injury. Pt reports that he was using a chisel at work today when it slipped, cutting his left medial middle finger. Reports immediate onset of 10/10, sharp, stabbing pain and bleeding. Bleeding was pulsatile. He is right handed. Reports pain has persisted since then. Denies any known weakness but has not moved his finger much. He has diminished sensation distally. No other injuries. Last tetanus was within 1 year.        Past Medical History:  Diagnosis Date   Hypertension    Lumbar herniated disc     There are no problems to display for this patient.   Past Surgical History:  Procedure Laterality Date   HERNIA REPAIR     as an infant    Prior to Admission medications   Medication Sig Start Date End Date Taking? Authorizing Provider  doxycycline (VIBRAMYCIN) 100 MG capsule Take 1 capsule (100 mg total) by mouth 2 (two) times daily for 7 days. 07/18/21 07/25/21 Yes Shaune Pollack, MD  ondansetron (ZOFRAN) 4 MG tablet Take 1 tablet (4 mg total) by mouth every 8 (eight) hours as needed for nausea or vomiting. 07/18/21 07/18/22 Yes Shaune Pollack, MD  oxyCODONE-acetaminophen (PERCOCET) 5-325 MG tablet Take 1-2 tablets by mouth every 6 (six) hours as needed for severe pain or moderate pain (no more than 6 tabs per day). 07/18/21 07/18/22 Yes Shaune Pollack, MD  busPIRone (BUSPAR) 10 MG tablet Take 1 tablet by mouth 2 (two) times daily. 07/26/17   [provider]  dibucaine (NUPERCAINAL) 1 % OINT Place 1 application rectally as needed for pain. 01/23/17    Little, Traci M, PA-C  LORazepam (ATIVAN) 1 MG tablet Take 1 mg by mouth every 8 (eight) hours as needed for anxiety.    [provider]    Allergies Augmentin [amoxicillin-pot clavulanate]  Family History  Problem Relation Age of Onset   Depression Maternal Grandmother    Hypertension Maternal Grandmother    Hypertension Paternal Grandmother    Diabetes Paternal Grandmother    Hypertension Paternal Grandfather    Diabetes Paternal Grandfather     Social History Social History   Tobacco Use   Smoking status: Former    Packs/day: 0.00    Types: Cigarettes   Smokeless tobacco: Never  Substance Use Topics   Alcohol use: No    Alcohol/week: 0.0 standard drinks   Drug use: No    Review of Systems  Review of Systems  Constitutional:  Negative for chills and fever.  HENT:  Negative for sore throat.   Respiratory:  Negative for shortness of breath.   Cardiovascular:  Negative for chest pain.  Gastrointestinal:  Negative for abdominal pain.  Genitourinary:  Negative for flank pain.  Musculoskeletal:  Negative for neck pain.  Skin:  Positive for wound. Negative for rash.  Allergic/Immunologic: Negative for immunocompromised state.  Neurological:  Negative for weakness and numbness.  Hematological:  Does not bruise/bleed easily.    ____________________________________________  PHYSICAL EXAM:      VITAL SIGNS: ED Triage Vitals  Enc Vitals Group     BP 07/18/21  1458 (!) 185/114     Pulse Rate 07/18/21 1458 91     Resp 07/18/21 1458 19     Temp 07/18/21 1458 98.3 F (36.8 C)     Temp Source 07/18/21 1458 Oral     SpO2 07/18/21 1458 97 %     Weight 07/18/21 1448 249 lb 1.9 oz (113 kg)     Height 07/18/21 1448 5\' 10"  (1.778 m)     Head Circumference --      Peak Flow --      Pain Score --      Pain Loc --      Pain Edu? --      Excl. in GC? --      Physical Exam Vitals and nursing note reviewed.  Constitutional:      General: He is not in acute  distress.    Appearance: He is well-developed.  HENT:     Head: Normocephalic and atraumatic.  Eyes:     Conjunctiva/sclera: Conjunctivae normal.  Cardiovascular:     Rate and Rhythm: Normal rate and regular rhythm.     Heart sounds: Normal heart sounds.  Pulmonary:     Effort: Pulmonary effort is normal. No respiratory distress.     Breath sounds: No wheezing.  Abdominal:     General: There is no distension.  Musculoskeletal:     Cervical back: Neck supple.  Skin:    General: Skin is warm.     Capillary Refill: Capillary refill takes less than 2 seconds.     Findings: No rash.  Neurological:     Mental Status: He is alert and oriented to person, place, and time.     Motor: No abnormal muscle tone.     UPPER EXTREMITY EXAM: LEFT  INSPECTION & PALPATION: Approx 3.5 cm linear, deep laceration running longitudinally along proximal phalanx, with exposed tendon, muscle bodies. Pulsatile bleeding noted.  SENSORY:  Sensation intact distal to injury along ulnar aspect of digit Diminished along medial aspect  MOTOR:  + Motor posterior interosseous nerve (thumb IP extension) + Anterior interosseous nerve (thumb IP flexion, index finger DIP flexion) + Radial nerve (wrist extension) + Median nerve (palpable firing thenar mass) + Ulnar nerve (palpable firing of first dorsal interosseous muscle)  VASCULAR: 2+ radial pulse Brisk capillary refill < 2 sec, fingers warm and well-perfused  TENDONS: Individual flexion/extension appears intact at MCP, PIP, DIP of middline finger   ____________________________________________   LABS (all labs ordered are listed, but only abnormal results are displayed)  Labs Reviewed - No data to display  ____________________________________________  EKG: None ________________________________________  RADIOLOGY All imaging, including plain films, CT scans, and ultrasounds, independently reviewed by me, and interpretations confirmed via formal  radiology reads.  ED MD interpretation:   XR Finger: No fracture or FB  Official radiology report(s): DG Finger Middle Left  Result Date: 07/18/2021 CLINICAL DATA:  Left middle finger laceration EXAM: LEFT MIDDLE FINGER 2+V COMPARISON:  None. FINDINGS: There is no evidence of fracture or dislocation. There is no evidence of arthropathy or other focal bone abnormality. No radiopaque foreign body. Soft tissue edema and bandage material about the left third digit. IMPRESSION: 1.  No fracture or dislocation of the left third digit. 2.  No radiopaque foreign body. 3. Soft tissue edema and bandage material about the left third digit. Electronically Signed   By: 07/20/2021 M.D.   On: 07/18/2021 16:48    ____________________________________________  PROCEDURES   Procedure(s) performed (including Critical  Care):  Marland KitchenMarland KitchenLaceration Repair  Date/Time: 07/18/2021 5:23 PM Performed by: Shaune Pollack, MD Authorized by: Shaune Pollack, MD   Consent:    Consent obtained:  Verbal   Consent given by:  Patient   Risks discussed:  Infection, need for additional repair, pain, tendon damage, retained foreign body, vascular damage, poor cosmetic result, poor wound healing and nerve damage   Alternatives discussed:  Referral and delayed treatment Universal protocol:    Patient identity confirmed:  Verbally with patient Anesthesia:    Anesthesia method:  Local infiltration   Local anesthetic:  Lidocaine 1% w/o epi Laceration details:    Location: Left middle finger.   Length (cm):  3.5   Depth (mm):  1 Pre-procedure details:    Preparation:  Patient was prepped and draped in usual sterile fashion and imaging obtained to evaluate for foreign bodies Exploration:    Hemostasis achieved with:  Direct pressure   Wound exploration: wound explored through full range of motion   Treatment:    Area cleansed with:  Povidone-iodine   Amount of cleaning:  Extensive   Irrigation solution:  Sterile water    Irrigation method:  Pressure wash   Layers/structures repaired:  Deep dermal/superficial fascia Deep dermal/superficial fascia:    Suture size:  5-0   Suture material:  Monocryl Skin repair:    Repair method:  Sutures   Suture size:  5-0 and 4-0   Suture material:  Prolene   Suture technique:  Simple interrupted and horizontal mattress   Number of sutures:  10 Approximation:    Approximation:  Close Repair type:    Repair type:  Intermediate Post-procedure details:    Dressing:  Antibiotic ointment, non-adherent dressing and sterile dressing   Procedure completion:  Tolerated well, no immediate complications  ____________________________________________  INITIAL IMPRESSION / MDM / ASSESSMENT AND PLAN / ED COURSE  As part of my medical decision making, I reviewed the following data within the electronic MEDICAL RECORD NUMBER Nursing notes reviewed and incorporated, Old chart reviewed, Notes from prior ED visits, and [place] Controlled Substance Database       *Brent Cuevas was evaluated in Emergency Department on 07/18/2021 for the symptoms described in the history of present illness. He was evaluated in the context of the global COVID-19 pandemic, which necessitated consideration that the patient might be at risk for infection with the SARS-CoV-2 virus that causes COVID-19. Institutional protocols and algorithms that pertain to the evaluation of patients at risk for COVID-19 are in a state of rapid change based on information released by regulatory bodies including the CDC and federal and state organizations. These policies and algorithms were followed during the patient's care in the ED.  Some ED evaluations and interventions may be delayed as a result of limited staffing during the pandemic.*     Medical Decision Making:  35 yo M here with laceration to medial aspect of left middle finger. Likely involving digital artery. Distal tip fully perfused with no involvement of contralateral  neurovascular bundle. Flexion/extension appears intact at MCP, PIP, and DIP joints. Tetanus updated. Plain film reviewed by me and shows no fx, no radio-opaque FB. Pt anesthetized then tourniquet applied to achieve hemostasis. Deep dermal sutures used then horizontal mattress used to pull subQ tissue to tamponade bleeding, as the proximal arterial injury was not easily visualized. The wound was then closed and observed for 40+ min with hemostasis and no significant hematoma formation. Compartments soft. Distal sensation subjectively impaired but o/w perfusion normal. Will  place in splint to facilitate healing and refer to ortho f/u given extent of injury. Doxy for wound prophylaxis. Return precautions given.  ____________________________________________  FINAL CLINICAL IMPRESSION(S) / ED DIAGNOSES  Final diagnoses:  Laceration of left middle finger without foreign body without damage to nail, initial encounter     MEDICATIONS GIVEN DURING THIS VISIT:  Medications  lidocaine (XYLOCAINE) 1 % (with pres) injection 10 mL (10 mLs Intradermal Given 07/18/21 1528)  lidocaine (XYLOCAINE) 1 % (with pres) injection 10 mL (10 mLs Intradermal Given by Other 07/18/21 1621)  oxyCODONE-acetaminophen (PERCOCET/ROXICET) 5-325 MG per tablet 2 tablet (2 tablets Oral Given 07/18/21 1626)  doxycycline (VIBRA-TABS) tablet 100 mg (100 mg Oral Given 07/18/21 1626)  ondansetron (ZOFRAN-ODT) disintegrating tablet 4 mg (4 mg Oral Given 07/18/21 1625)     ED Discharge Orders          Ordered    oxyCODONE-acetaminophen (PERCOCET) 5-325 MG tablet  Every 6 hours PRN        07/18/21 1717    ondansetron (ZOFRAN) 4 MG tablet  Every 8 hours PRN        07/18/21 1717    doxycycline (VIBRAMYCIN) 100 MG capsule  2 times daily        07/18/21 1717             Note:  This document was prepared using Dragon voice recognition software and may include unintentional dictation errors.   Shaune Pollack, MD 07/18/21  1726

## 2021-07-18 NOTE — Discharge Instructions (Signed)
Take the antibiotics as prescribed  Take Ibuprofen 600 mg (over-the-counter) every 6-8 hours for moderate pain  Take the Oxycodone for severe pain  I'd recommend ELEVATING your finger, and icing it as often as possible, for at least the next 24 hours  Follow-up with Orthopedics in 2-3 days  Keep the current dressing on for 2 days, then start changing daily  Keep the finger splint in place until follow-up  No swimming or submerging your hand underwater

## 2021-07-18 NOTE — ED Triage Notes (Signed)
Laceration to left hand  states he was using a chisel  laceration noted to web space

## 2021-08-07 ENCOUNTER — Encounter: Payer: Self-pay | Admitting: Emergency Medicine

## 2021-08-07 ENCOUNTER — Emergency Department
Admission: EM | Admit: 2021-08-07 | Discharge: 2021-08-08 | Disposition: A | Discharge status: Home / Self Care | Payer: Self-pay | Attending: Orthopedic Surgery | Admitting: Orthopedic Surgery

## 2021-08-07 ENCOUNTER — Other Ambulatory Visit: Payer: Self-pay

## 2021-08-07 DIAGNOSIS — I10 Essential (primary) hypertension: Secondary | ICD-10-CM | POA: Insufficient documentation

## 2021-08-07 DIAGNOSIS — R2232 Localized swelling, mass and lump, left upper limb: Secondary | ICD-10-CM

## 2021-08-07 DIAGNOSIS — M65332 Trigger finger, left middle finger: Secondary | ICD-10-CM | POA: Insufficient documentation

## 2021-08-07 MED ORDER — PREDNISONE 10 MG PO TABS: ORAL_TABLET | ORAL | 0 refills | Status: DC

## 2021-08-07 MED ORDER — HYDROCODONE-ACETAMINOPHEN 5-325 MG PO TABS: 1.0000 | ORAL_TABLET | ORAL | 0 refills | Status: AC | PRN

## 2021-08-07 NOTE — ED Provider Notes (Signed)
[facility] EMERGENCY DEPARTMENT Provider Note   CSN: [id number] Arrival date & time: 08/07/21  1652     History  Chief Complaint  Patient presents with   Hand Pain    Brent Cuevas is a 36 y.o. male with past medical history of hypertension, lumbar disc disease and left middle finger laceration that occurred on 07/18/2021 presents for evaluation of left middle finger swelling, limited range of motion.  Patient suffered a laceration on 1226 after a chisel went into the ulnar aspect of the proximal phalanx.  No volar digit involvement.  Patient was placed on antibiotic, doxycycline.  He finished this antibiotic and has been taking his wife cephalexin which she has 3 days remaining.  He denies any warmth redness or drainage but has had persistent swelling and pain.  He has pain along the A1 pulley and along the PIP joint with swelling throughout the proximal phalanx.  HPI     Home Medications Prior to Admission medications   Medication Sig Start Date End Date Taking? Authorizing Provider  busPIRone (BUSPAR) 10 MG tablet Take 1 tablet by mouth 2 (two) times daily. 07/26/17   [provider]  dibucaine (NUPERCAINAL) 1 % OINT Place 1 application rectally as needed for pain. 01/23/17   Little, Traci M, PA-C  HYDROcodone-acetaminophen (NORCO) 5-325 MG tablet Take 1 tablet by mouth every 4 (four) hours as needed for moderate pain. 08/07/21  Yes Evon Slack, PA-C  LORazepam (ATIVAN) 1 MG tablet Take 1 mg by mouth every 8 (eight) hours as needed for anxiety.    [provider]  ondansetron (ZOFRAN) 4 MG tablet Take 1 tablet (4 mg total) by mouth every 8 (eight) hours as needed for nausea or vomiting. 07/18/21 07/18/22  Shaune Pollack, MD  oxyCODONE-acetaminophen (PERCOCET) 5-325 MG tablet Take 1-2 tablets by mouth every 6 (six) hours as needed for severe pain or moderate pain (no more than 6 tabs per day). 07/18/21 07/18/22  Shaune Pollack, MD   predniSONE (DELTASONE) 10 MG tablet 10 day taper. 5,5,4,4,3,3,2,2,1,1 08/07/21  Yes Evon Slack, PA-C      Allergies    Augmentin [amoxicillin-pot clavulanate]    Review of Systems   Review of Systems  Physical Exam Updated Vital Signs BP (!) 154/82 (BP Location: Left Arm)    Pulse 79    Temp 98.4 F (36.9 C) (Oral)    Resp 20    Ht 5\' 10"  (1.778 m)    Wt 113 kg    SpO2 100%    BMI 35.74 kg/m  Physical Exam Constitutional:      Appearance: He is well-developed.  HENT:     Head: Normocephalic and atraumatic.  Eyes:     Conjunctiva/sclera: Conjunctivae normal.  Cardiovascular:     Rate and Rhythm: Normal rate.  Pulmonary:     Effort: Pulmonary effort is normal. No respiratory distress.  Musculoskeletal:     Cervical back: Normal range of motion.     Comments: Left middle finger is swollen just at the proximal phalanx.  He has good flexion and extension with active range of motion of the PIP and the DIP joint.  He can flex at the MCP of the middle finger but is limited and has pain.  He is nontender to palpation throughout the proximal middle or distal phalanx.  He is very tender along the A1 pulley with palpable catching on exam.  Laceration site appears to be well-healed with no infectious process, no warmth  or redness but is quite swollen.  Skin:    General: Skin is warm.     Findings: No rash.  Neurological:     Mental Status: He is alert and oriented to person, place, and time.  Psychiatric:        Behavior: Behavior normal.        Thought Content: Thought content normal.    ED Results / Procedures / Treatments   Labs (all labs ordered are listed, but only abnormal results are displayed) Labs Reviewed - No data to display  EKG None  Radiology No results found.  Procedures Procedures    Medications Ordered in ED Medications - No data to display  ED Course/ Medical Decision Making/ A&P                           Medical Decision Making  36 year old  male with laceration to the ulnar aspect of the left middle finger proximal phalanx back on 07/18/2021.  Wound has healed, no tendon involvement on notes reviewed from 07/18/2021.  Patient has been on antibiotics.  There is no sign of any infectious process but patient does have significant inflammation consistent with flexor tenosynovitis and a trigger finger of the left middle finger.  Patient encouraged to soak the hand in warm water and Epson salt and massage area of swelling into the palm.  He is placed on steroid taper, will finish out his antibiotics just to be on the safe side and recommend he call orthopedics tomorrow to schedule follow-up appointment.  He understands signs and symptoms return to the ER for. Final Clinical Impression(s) / ED Diagnoses Final diagnoses:  Localized swelling of finger of left hand  Trigger finger, left middle finger    Rx / DC Orders ED Discharge Orders          Ordered    predniSONE (DELTASONE) 10 MG tablet        08/07/21 1724    HYDROcodone-acetaminophen (NORCO) 5-325 MG tablet  Every 4 hours PRN        08/07/21 1724              Renata Caprice 08/07/21 1729    Vanessa [place], MD 08/07/21 [phone]

## 2021-08-07 NOTE — Discharge Instructions (Addendum)
Please take please continue with antibiotics and start prednisone.  You may use Norco as needed for more severe pain.  Soak finger daily in warm water and peroxide.  Work on passive range of motion exercises.  Call orthopedics tomorrow to schedule follow-up appointment.

## 2021-08-07 NOTE — ED Triage Notes (Signed)
Pt reports cut his left hand middle finger the day after christmas, was seen here, received stitches and advised to follow up with a hand specialist. Pt reports he took the stitches out himself, got a referral to a specialist but was not able to go due to the cost. Pt reports has continued pressure and some swelling to his middle finger and needs it checked out.

## 2022-02-27 ENCOUNTER — Encounter: Payer: Self-pay | Admitting: Emergency Medicine

## 2022-02-27 ENCOUNTER — Emergency Department
Admission: EM | Admit: 2022-02-27 | Discharge: 2022-02-28 | Discharge status: Against Medical Advice | Payer: No Typology Code available for payment source | Attending: Emergency Medicine | Admitting: Emergency Medicine

## 2022-02-27 DIAGNOSIS — Z5321 Procedure and treatment not carried out due to patient leaving prior to being seen by health care provider: Secondary | ICD-10-CM | POA: Insufficient documentation

## 2022-02-27 DIAGNOSIS — R1032 Left lower quadrant pain: Secondary | ICD-10-CM | POA: Diagnosis not present

## 2022-02-27 LAB — URINALYSIS, ROUTINE W REFLEX MICROSCOPIC
Bacteria, UA: NONE SEEN
Bilirubin Urine: NEGATIVE
Glucose, UA: NEGATIVE mg/dL
Hgb urine dipstick: NEGATIVE
Ketones, ur: NEGATIVE mg/dL
Leukocytes,Ua: NEGATIVE
Nitrite: NEGATIVE
Protein, ur: 100 mg/dL — AB
Specific Gravity, Urine: 1.029 (ref 1.005–1.030)
pH: 5 (ref 5.0–8.0)

## 2022-02-27 LAB — CBC
HCT: 39.4 % (ref 39.0–52.0)
Hemoglobin: 12.9 g/dL — ABNORMAL LOW (ref 13.0–17.0)
MCH: 29.2 pg (ref 26.0–34.0)
MCHC: 32.7 g/dL (ref 30.0–36.0)
MCV: 89.1 fL (ref 80.0–100.0)
Platelets: 222 10*3/uL (ref 150–400)
RBC: 4.42 MIL/uL (ref 4.22–5.81)
RDW: 13.4 % (ref 11.5–15.5)
WBC: 10.1 10*3/uL (ref 4.0–10.5)
nRBC: 0 % (ref 0.0–0.2)

## 2022-02-27 LAB — COMPREHENSIVE METABOLIC PANEL
ALT: 17 U/L (ref 0–44)
AST: 19 U/L (ref 15–41)
Albumin: 4.4 g/dL (ref 3.5–5.0)
Alkaline Phosphatase: 74 U/L (ref 38–126)
Anion gap: 9 (ref 5–15)
BUN: 11 mg/dL (ref 6–20)
CO2: 26 mmol/L (ref 22–32)
Calcium: 9.2 mg/dL (ref 8.9–10.3)
Chloride: 107 mmol/L (ref 98–111)
Creatinine, Ser: 1.01 mg/dL (ref 0.61–1.24)
GFR, Estimated: >60 mL/min (ref >60)
Glucose, Bld: 108 mg/dL — ABNORMAL HIGH (ref 70–99)
Potassium: 3 mmol/L — ABNORMAL LOW (ref 3.5–5.1)
Sodium: 142 mmol/L (ref 135–145)
Total Bilirubin: 0.7 mg/dL (ref 0.3–1.2)
Total Protein: 8 g/dL (ref 6.5–8.1)

## 2022-02-27 LAB — LIPASE, BLOOD: Lipase: 24 U/L (ref 11–51)

## 2022-02-27 NOTE — ED Triage Notes (Signed)
Pt presents via POV with complaints LLQ pain that started 1 hour ago. Pt endorses normal BMS. Pt notes feeling a "bump" in his stomach that he "moved over to the left side" of his stomach. Denies N/V, CP, SOB, or fevers.

## 2022-02-28 NOTE — ED Notes (Signed)
Pt informed registration staff that he is leaving.  Will change dispo at this time.

## 2022-10-30 ENCOUNTER — Emergency Department: Payer: No Typology Code available for payment source

## 2022-10-30 ENCOUNTER — Emergency Department
Admission: EM | Admit: 2022-10-30 | Discharge: 2022-10-30 | Disposition: A | Discharge status: Home / Self Care | Payer: No Typology Code available for payment source | Attending: Emergency Medicine | Admitting: Emergency Medicine

## 2022-10-30 DIAGNOSIS — X501XXA Overexertion from prolonged static or awkward postures, initial encounter: Secondary | ICD-10-CM | POA: Diagnosis not present

## 2022-10-30 DIAGNOSIS — Y93K1 Activity, walking an animal: Secondary | ICD-10-CM | POA: Insufficient documentation

## 2022-10-30 DIAGNOSIS — S99911A Unspecified injury of right ankle, initial encounter: Secondary | ICD-10-CM | POA: Diagnosis present

## 2022-10-30 DIAGNOSIS — S93401A Sprain of unspecified ligament of right ankle, initial encounter: Secondary | ICD-10-CM | POA: Diagnosis not present

## 2022-10-30 MED ORDER — HYDROCODONE-ACETAMINOPHEN 5-325 MG PO TABS
1.0000 | ORAL_TABLET | Freq: Once | ORAL | Status: AC
  Administered 2022-10-30: 1 via ORAL
  Filled 2022-10-30: qty 1

## 2022-10-30 MED ORDER — MELOXICAM 15 MG PO TABS: 15.0000 mg | ORAL_TABLET | Freq: Every day | ORAL | 0 refills | Status: AC

## 2022-10-30 MED ORDER — KETOROLAC TROMETHAMINE 30 MG/ML IJ SOLN
30.0000 mg | Freq: Once | INTRAMUSCULAR | Status: AC
Start: 2022-10-30 — End: 2022-10-30
  Administered 2022-10-30: 30 mg via INTRAMUSCULAR
  Filled 2022-10-30: qty 1

## 2022-10-30 MED ORDER — HYDROCODONE-ACETAMINOPHEN 5-325 MG PO TABS: 1.0000 | ORAL_TABLET | ORAL | 0 refills | Status: AC | PRN

## 2022-10-30 NOTE — ED Triage Notes (Signed)
Pt to ED via POV from home. Pt was walking his dog today and tried to prevent dog from getting bone and injured right foot.

## 2022-10-30 NOTE — ED Provider Notes (Signed)
[facility] Provider Note  Patient Contact: 6:08 PM (approximate)   History   Fall   HPI  Brent Cuevas is a 37 y.o. male who presents emergency department complaining of ankle injury.  Patient states that he was going out, walking his dog.  He went to kick a chicken bone out of the way that the dog would not bite it, when the dog took off catching his foot and causing it to bend awkwardly in a plantarflexion type manner.  Patient states that he can walk on his heel but cannot put pressure over the ball or toes as it puts pressure along the anterior ankle.  Patient is use ice, anti-inflammatories with no significant improvement.  No open wounds.  No history of previous injuries to this joint.  No complaints at this time.     Physical Exam   Triage Vital Signs: ED Triage Vitals  Enc Vitals Group     BP 10/30/22 1658 (!) 150/86     Pulse Rate 10/30/22 1658 89     Resp 10/30/22 1658 18     Temp 10/30/22 1658 98 F (36.7 C)     Temp Source 10/30/22 1658 Oral     SpO2 10/30/22 1658 99 %     Weight --      Height --      Head Circumference --      Peak Flow --      Pain Score 10/30/22 1654 7     Pain Loc --      Pain Edu? --      Excl. in GC? --     Most recent vital signs: Vitals:   10/30/22 1658  BP: (!) 150/86  Pulse: 89  Resp: 18  Temp: 98 F (36.7 C)  SpO2: 99%     General: Alert and in no acute distress.  Cardiovascular:  Good peripheral perfusion Respiratory: Normal respiratory effort without tachypnea or retractions. Lungs CTAB.  Musculoskeletal: Full range of motion to all extremities.  Visualization of the right ankle reveals mild edema when compared to left.  No open wounds.  No deformity.  Patient has limited range of motion due to pain but can extend and flex his ankle at this time.  Tender along the anterior joint line without palpable abnormality.  Pulses, sensation intact distally. Neurologic:  No gross focal neurologic  deficits are appreciated.  Skin:   No rash noted Other:   ED Results / Procedures / Treatments   Labs (all labs ordered are listed, but only abnormal results are displayed) Labs Reviewed - No data to display   EKG     RADIOLOGY  I personally viewed, evaluated, and interpreted these images as part of my medical decision making, as well as reviewing the written report by the radiologist.  ED Provider Interpretation: No acute traumatic findings on x-ray of the ankle  DG Ankle Complete Right  Result Date: 10/30/2022 CLINICAL DATA:  Recent fall while walking dog EXAM: RIGHT ANKLE - COMPLETE 3 VIEW COMPARISON:  None Available. FINDINGS: There is no evidence of fracture, dislocation, or joint effusion. There is no evidence of arthropathy or other focal bone abnormality. Soft tissues are unremarkable. IMPRESSION: Negative. Electronically Signed   By: Jacob Moores M.D.   On: 10/30/2022 17:28    PROCEDURES:  Critical Care performed: No  Procedures   MEDICATIONS ORDERED IN ED: Medications  ketorolac (TORADOL) 30 MG/ML injection 30 mg (has no administration in time range)  HYDROcodone-acetaminophen (NORCO/VICODIN) 5-325 MG per tablet 1 tablet (has no administration in time range)     IMPRESSION / MDM / ASSESSMENT AND PLAN / ED COURSE  I reviewed the triage vital signs and the nursing notes.                                 Differential diagnosis includes, but is not limited to, ankle fracture, ankle dislocation, ligamentous tear, ankle sprain  Patient's presentation is most consistent with acute presentation with potential threat to life or bodily function.   Patient's diagnosis is consistent with a sprain.  Patient presents emergency department after having an injury to the right ankle.  Patient was attempting to kick the bone out of the way when his dog ran into his foot causing him to have extreme plantarflexion.  Hurting along the anterior joint line.  No separation of  the joint on x-ray.  No fracture.  Patient has no open wounds.  Suspect ankle sprain based off of the injury.  Will treat with ASO lace up brace, anti-inflammatory..  Follow-up with orthopedics as needed.  Patient is given ED precautions to return to the ED for any worsening or new symptoms.     FINAL CLINICAL IMPRESSION(S) / ED DIAGNOSES   Final diagnoses:  Sprain of right ankle, unspecified ligament, initial encounter     Rx / DC Orders   ED Discharge Orders          Ordered    meloxicam (MOBIC) 15 MG tablet  Daily        10/30/22 1854    HYDROcodone-acetaminophen (NORCO/VICODIN) 5-325 MG tablet  Every 4 hours PRN        10/30/22 1854             Note:  This document was prepared using Dragon voice recognition software and may include unintentional dictation errors.   Racheal Patches, PA-C 10/30/22 1854    Corena Herter, MD 10/31/22 [phone]

## 2022-10-30 NOTE — ED Notes (Signed)
ASO brace applied, crutches given.

## 2023-03-29 ENCOUNTER — Emergency Department: Payer: No Typology Code available for payment source

## 2023-03-29 ENCOUNTER — Other Ambulatory Visit: Payer: Self-pay

## 2023-03-29 ENCOUNTER — Emergency Department
Admission: EM | Admit: 2023-03-29 | Discharge: 2023-03-29 | Disposition: A | Discharge status: Home / Self Care | Payer: No Typology Code available for payment source | Attending: Emergency Medicine | Admitting: Emergency Medicine

## 2023-03-29 DIAGNOSIS — T71163A Asphyxiation due to hanging, assault, initial encounter: Secondary | ICD-10-CM | POA: Diagnosis not present

## 2023-03-29 DIAGNOSIS — T71193A Asphyxiation due to mechanical threat to breathing due to other causes, assault, initial encounter: Secondary | ICD-10-CM

## 2023-03-29 DIAGNOSIS — R410 Disorientation, unspecified: Secondary | ICD-10-CM | POA: Diagnosis not present

## 2023-03-29 DIAGNOSIS — Y9389 Activity, other specified: Secondary | ICD-10-CM | POA: Diagnosis not present

## 2023-03-29 DIAGNOSIS — S199XXA Unspecified injury of neck, initial encounter: Secondary | ICD-10-CM | POA: Diagnosis present

## 2023-03-29 LAB — BASIC METABOLIC PANEL
Anion gap: 10 (ref 5–15)
BUN: 11 mg/dL (ref 6–20)
CO2: 27 mmol/L (ref 22–32)
Calcium: 9.2 mg/dL (ref 8.9–10.3)
Chloride: 101 mmol/L (ref 98–111)
Creatinine, Ser: 0.89 mg/dL (ref 0.61–1.24)
GFR, Estimated: >60 mL/min (ref >60)
Glucose, Bld: 91 mg/dL (ref 70–99)
Potassium: 3.5 mmol/L (ref 3.5–5.1)
Sodium: 138 mmol/L (ref 135–145)

## 2023-03-29 LAB — CBC
HCT: 40.1 % (ref 39.0–52.0)
Hemoglobin: 13.4 g/dL (ref 13.0–17.0)
MCH: 29.5 pg (ref 26.0–34.0)
MCHC: 33.4 g/dL (ref 30.0–36.0)
MCV: 88.3 fL (ref 80.0–100.0)
Platelets: 232 10*3/uL (ref 150–400)
RBC: 4.54 MIL/uL (ref 4.22–5.81)
RDW: 13.1 % (ref 11.5–15.5)
WBC: 10.6 10*3/uL — ABNORMAL HIGH (ref 4.0–10.5)
nRBC: 0 % (ref 0.0–0.2)

## 2023-03-29 MED ORDER — KETOROLAC TROMETHAMINE 15 MG/ML IJ SOLN
15.0000 mg | Freq: Once | INTRAMUSCULAR | Status: AC
  Administered 2023-03-29: 15 mg via INTRAVENOUS
  Filled 2023-03-29: qty 1

## 2023-03-29 MED ORDER — MORPHINE SULFATE (PF) 4 MG/ML IV SOLN
4.0000 mg | Freq: Once | INTRAVENOUS | Status: DC
Start: 2023-03-29 — End: 2023-03-29
  Filled 2023-03-29: qty 1

## 2023-03-29 MED ORDER — SODIUM CHLORIDE 0.9 % IV BOLUS
1000.0000 mL | Freq: Once | INTRAVENOUS | Status: AC
Start: 2023-03-29 — End: 2023-03-29
  Administered 2023-03-29: 1000 mL via INTRAVENOUS

## 2023-03-29 MED ORDER — IOHEXOL 350 MG/ML SOLN
75.0000 mL | Freq: Once | INTRAVENOUS | Status: AC | PRN
  Administered 2023-03-29: 75 mL via INTRAVENOUS

## 2023-03-29 MED ORDER — OXYCODONE-ACETAMINOPHEN 5-325 MG PO TABS
1.0000 | ORAL_TABLET | Freq: Once | ORAL | Status: AC
  Administered 2023-03-29: 1 via ORAL
  Filled 2023-03-29: qty 1

## 2023-03-29 MED ORDER — OXYCODONE-ACETAMINOPHEN 7.5-325 MG PO TABS
1.0000 | ORAL_TABLET | Freq: Three times a day (TID) | ORAL | 0 refills | Status: AC | PRN
Start: 2023-03-29 — End: 2024-03-28

## 2023-03-29 MED ORDER — HYDROMORPHONE HCL 1 MG/ML IJ SOLN
1.0000 mg | Freq: Once | INTRAMUSCULAR | Status: AC
  Administered 2023-03-29: 1 mg via INTRAVENOUS
  Filled 2023-03-29: qty 1

## 2023-03-29 MED ORDER — PREDNISONE 10 MG PO TABS
10.0000 mg | ORAL_TABLET | Freq: Every day | ORAL | 0 refills | Status: AC
Start: 2023-03-29 — End: ?

## 2023-03-29 MED ORDER — DEXAMETHASONE SODIUM PHOSPHATE 10 MG/ML IJ SOLN
10.0000 mg | Freq: Once | INTRAMUSCULAR | Status: AC
Start: 2023-03-29 — End: 2023-03-29
  Administered 2023-03-29: 10 mg via INTRAVENOUS
  Filled 2023-03-29: qty 1
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
## 2023-03-29 NOTE — Discharge Instructions (Addendum)
Please eat a soft diet.  Take prednisone taper as prescribed.  Use oxycodone as needed for moderate to severe pain.  Please return to the ER for any severe increasing throat pain, swelling, difficulty swallowing or breathing.

## 2023-04-02 NOTE — Group Note (Deleted)

## 2023-07-03 ENCOUNTER — Encounter: Payer: Self-pay | Admitting: *Deleted

## 2023-07-03 ENCOUNTER — Other Ambulatory Visit: Payer: Self-pay

## 2023-07-03 ENCOUNTER — Emergency Department
Admission: EM | Admit: 2023-07-03 | Discharge: 2023-07-04 | Disposition: A | Discharge status: Home / Self Care | Payer: No Typology Code available for payment source | Attending: Emergency Medicine | Admitting: Emergency Medicine

## 2023-07-03 DIAGNOSIS — I10 Essential (primary) hypertension: Secondary | ICD-10-CM | POA: Diagnosis present

## 2023-07-03 DIAGNOSIS — R519 Headache, unspecified: Secondary | ICD-10-CM

## 2023-07-03 LAB — CBC
HCT: 43 % (ref 39.0–52.0)
Hemoglobin: 14.5 g/dL (ref 13.0–17.0)
MCH: 30.4 pg (ref 26.0–34.0)
MCHC: 33.7 g/dL (ref 30.0–36.0)
MCV: 90.1 fL (ref 80.0–100.0)
Platelets: 259 10*3/uL (ref 150–400)
RBC: 4.77 MIL/uL (ref 4.22–5.81)
RDW: 13.2 % (ref 11.5–15.5)
WBC: 9.6 10*3/uL (ref 4.0–10.5)
nRBC: 0 % (ref 0.0–0.2)

## 2023-07-03 LAB — BASIC METABOLIC PANEL
Anion gap: 9 (ref 5–15)
BUN: 12 mg/dL (ref 6–20)
CO2: 27 mmol/L (ref 22–32)
Calcium: 9.1 mg/dL (ref 8.9–10.3)
Chloride: 100 mmol/L (ref 98–111)
Creatinine, Ser: 0.95 mg/dL (ref 0.61–1.24)
GFR, Estimated: >60 mL/min (ref >60)
Glucose, Bld: 116 mg/dL — ABNORMAL HIGH (ref 70–99)
Potassium: 3.6 mmol/L (ref 3.5–5.1)
Sodium: 136 mmol/L (ref 135–145)

## 2023-07-03 MED ORDER — ACETAMINOPHEN 500 MG PO TABS
1000.0000 mg | ORAL_TABLET | Freq: Once | ORAL | Status: AC
  Administered 2023-07-04: 1000 mg via ORAL
  Filled 2023-07-03: qty 2

## 2023-07-03 NOTE — ED Triage Notes (Addendum)
Pt ambulatory to triage.  Pt reports blood pressure elevated tonight.   Pt taking bp meds as prescribed.  Pt states he also took ativan tonight   No n/v/   no chest pain or sob.  Pt alert  speech clear.

## 2023-07-03 NOTE — ED Provider Notes (Signed)
[facility] Provider Note    Event Date/Time   First MD Initiated Contact with Patient 07/03/23 2331     (approximate)   History   Hypertension   HPI  Brent Cuevas is a 37 y.o. male past med history significant for hypertension, panic attacks, presents to the emergency department with concern for headache and possible panic attack.  States that he was urinating earlier tonight and had a electric feeling in his head and neck.  States and that put him into a panic attack.  Checked his blood pressure was significantly elevated.  Ongoing pressure to the back of his head.  Denies any change in vision or slurring of speech.  Denies any dizziness, extremity numbness or weakness.  No trouble with ambulation.  No recent neck manipulation or trauma.  No falls.  No chest pain or shortness of breath.  Denies any nausea or vomiting.     Physical Exam   Triage Vital Signs: ED Triage Vitals  Encounter Vitals Group     BP 07/03/23 2128 (!) 168/101     Systolic BP Percentile --      Diastolic BP Percentile --      Pulse Rate 07/03/23 2128 73     Resp 07/03/23 2128 18     Temp 07/03/23 2128 97.8 F (36.6 C)     Temp Source 07/03/23 2128 Oral     SpO2 07/03/23 2128 98 %     Weight 07/03/23 2126 249 lb (112.9 kg)     Height 07/03/23 2126 5\' 10"  (1.778 m)     Head Circumference --      Peak Flow --      Pain Score 07/03/23 2126 0     Pain Loc --      Pain Education --      Exclude from Growth Chart --     Most recent vital signs: Vitals:   07/03/23 2128 07/03/23 2255  BP: (!) 168/101 (!) 153/94  Pulse: 73   Resp: 18   Temp: 97.8 F (36.6 C)   SpO2: 98%     Physical Exam Constitutional:      Appearance: He is well-developed.  HENT:     Head: Atraumatic.  Eyes:     Conjunctiva/sclera: Conjunctivae normal.  Cardiovascular:     Rate and Rhythm: Regular rhythm.  Pulmonary:     Effort: No respiratory distress.  Abdominal:     General: There is  no distension.     Tenderness: There is no abdominal tenderness.  Musculoskeletal:     Cervical back: Normal range of motion.     Right lower leg: No edema.     Left lower leg: No edema.     Comments: +2 DP pulses that are equal, +2 radial pulses that are equal  Skin:    General: Skin is warm.  Neurological:     Mental Status: He is alert. Mental status is at baseline.     GCS: GCS eye subscore is 4. GCS verbal subscore is 5. GCS motor subscore is 6.     Cranial Nerves: Cranial nerves 2-12 are intact.     Sensory: Sensation is intact.     Motor: Motor function is intact.     Coordination: Coordination is intact.     Gait: Gait is intact.     IMPRESSION / MDM / ASSESSMENT AND PLAN / ED COURSE  I reviewed the triage vital signs and the nursing notes.  Differential diagnosis  including panic attack, primary headache, subarachnoid hemorrhage, hypertension, CKD  EKG  I, Corena Herter, the attending physician, personally viewed and interpreted this ECG.  Sinus bradycardia.  Normal intervals.  No chamber enlargement.  No significant ST elevation or depression.  Isolated T wave that is inverted to lead aVL  RADIOLOGY I independently reviewed imaging, my interpretation of imaging: CT scan of the head with no signs of intracranial hemorrhage.  No mass.  Read as no acute findings  LABS (all labs ordered are listed, but only abnormal results are displayed) Labs interpreted as -    Labs Reviewed  BASIC METABOLIC PANEL - Abnormal; Notable for the following components:      Result Value   Glucose, Bld 116 (*)    All other components within normal limits  CBC  TROPONIN I (HIGH SENSITIVITY)     MDM  CT scan of the head done within 3 hours of onset of headache, negative, have a low suspicion for subarachnoid hemorrhage.  Do not feel that lumbar puncture is necessary at this time.  Patient was given Tylenol for his headache.  Lab work overall reassuring with no significant leukocytosis  and creatinine is at baseline.  Troponin negative.  Patient took an Ativan prior to arrival  On reevaluation patient states he is feeling much better.  Repeat blood pressure stable.  Patient had ongoing elevated blood pressure.  New medication was added on today after he was seen at the [place].  Patient has plenty of refills, discussed the importance of maintaining consistent with his blood pressure and given return precautions for any ongoing or worsening symptoms.     PROCEDURES:  Critical Care performed: No  Procedures  Patient's presentation is most consistent with acute presentation with potential threat to life or bodily function.   MEDICATIONS ORDERED IN ED: Medications  acetaminophen (TYLENOL) tablet 1,000 mg (1,000 mg Oral Given 07/04/23 0031)    FINAL CLINICAL IMPRESSION(S) / ED DIAGNOSES   Final diagnoses:  Uncontrolled hypertension  Acute nonintractable headache, unspecified headache type     Rx / DC Orders   ED Discharge Orders     None        Note:  This document was prepared using Dragon voice recognition software and may include unintentional dictation errors.   Corena Herter, MD 07/04/23 [phone]

## 2023-07-04 ENCOUNTER — Emergency Department: Payer: No Typology Code available for payment source

## 2023-07-04 LAB — TROPONIN I (HIGH SENSITIVITY): Troponin I (High Sensitivity): 7 ng/L (ref <18)

## 2023-07-04 NOTE — Discharge Instructions (Signed)
You are seen in the emergency department for a headache and high blood pressure.  You had a CT scan of your head that did not show any signs of a bleeding.  Your lab work was at your normal.  Your heart enzyme was normal, do not believe you are having a heart attack today.  Your EKG was normal.  You are given Tylenol for your headache.  Continue to take your blood pressure medication as prescribed by your primary care provider.  Return to the emergency department for any ongoing or worsening symptoms.  Thank you for choosing [place] for your health care, it was my pleasure to care for you today!  Corena Herter, MD
